# Patient Record
Sex: Male | Born: 1940 | Race: White | Hispanic: No
Health system: Southern US, Community
[De-identification: ages and names within clinical notes are randomized; demographics above are authoritative.]

## PROBLEM LIST (undated history)

## (undated) DIAGNOSIS — E039 Hypothyroidism, unspecified: Secondary | ICD-10-CM

## (undated) DIAGNOSIS — R251 Tremor, unspecified: Secondary | ICD-10-CM

## (undated) DIAGNOSIS — I10 Essential (primary) hypertension: Secondary | ICD-10-CM

## (undated) DIAGNOSIS — F200 Paranoid schizophrenia: Secondary | ICD-10-CM

## (undated) DIAGNOSIS — R339 Retention of urine, unspecified: Secondary | ICD-10-CM

## (undated) DIAGNOSIS — R569 Unspecified convulsions: Secondary | ICD-10-CM

---

## 2011-04-15 DIAGNOSIS — R269 Unspecified abnormalities of gait and mobility: Secondary | ICD-10-CM | POA: Diagnosis not present

## 2011-04-15 DIAGNOSIS — F411 Generalized anxiety disorder: Secondary | ICD-10-CM | POA: Diagnosis not present

## 2011-04-15 DIAGNOSIS — Z466 Encounter for fitting and adjustment of urinary device: Secondary | ICD-10-CM | POA: Diagnosis not present

## 2011-04-15 DIAGNOSIS — I10 Essential (primary) hypertension: Secondary | ICD-10-CM | POA: Diagnosis not present

## 2011-04-15 DIAGNOSIS — F209 Schizophrenia, unspecified: Secondary | ICD-10-CM | POA: Diagnosis not present

## 2011-05-29 DIAGNOSIS — N401 Enlarged prostate with lower urinary tract symptoms: Secondary | ICD-10-CM | POA: Diagnosis not present

## 2011-05-29 DIAGNOSIS — R339 Retention of urine, unspecified: Secondary | ICD-10-CM | POA: Diagnosis not present

## 2011-06-03 DIAGNOSIS — N400 Enlarged prostate without lower urinary tract symptoms: Secondary | ICD-10-CM | POA: Diagnosis not present

## 2011-06-03 DIAGNOSIS — I959 Hypotension, unspecified: Secondary | ICD-10-CM | POA: Diagnosis not present

## 2011-06-03 DIAGNOSIS — F209 Schizophrenia, unspecified: Secondary | ICD-10-CM | POA: Diagnosis not present

## 2011-06-03 DIAGNOSIS — G219 Secondary parkinsonism, unspecified: Secondary | ICD-10-CM | POA: Diagnosis not present

## 2011-06-12 DIAGNOSIS — R4182 Altered mental status, unspecified: Secondary | ICD-10-CM | POA: Diagnosis not present

## 2011-06-12 DIAGNOSIS — N390 Urinary tract infection, site not specified: Secondary | ICD-10-CM | POA: Diagnosis not present

## 2011-06-14 DIAGNOSIS — N401 Enlarged prostate with lower urinary tract symptoms: Secondary | ICD-10-CM | POA: Diagnosis not present

## 2011-06-14 DIAGNOSIS — R269 Unspecified abnormalities of gait and mobility: Secondary | ICD-10-CM | POA: Diagnosis not present

## 2011-06-14 DIAGNOSIS — R339 Retention of urine, unspecified: Secondary | ICD-10-CM | POA: Diagnosis not present

## 2011-06-14 DIAGNOSIS — F209 Schizophrenia, unspecified: Secondary | ICD-10-CM | POA: Diagnosis not present

## 2011-06-14 DIAGNOSIS — I10 Essential (primary) hypertension: Secondary | ICD-10-CM | POA: Diagnosis not present

## 2011-06-15 DIAGNOSIS — F209 Schizophrenia, unspecified: Secondary | ICD-10-CM | POA: Diagnosis not present

## 2011-06-15 DIAGNOSIS — R269 Unspecified abnormalities of gait and mobility: Secondary | ICD-10-CM | POA: Diagnosis not present

## 2011-06-15 DIAGNOSIS — N390 Urinary tract infection, site not specified: Secondary | ICD-10-CM | POA: Diagnosis not present

## 2011-06-15 DIAGNOSIS — I10 Essential (primary) hypertension: Secondary | ICD-10-CM | POA: Diagnosis not present

## 2011-06-15 DIAGNOSIS — N401 Enlarged prostate with lower urinary tract symptoms: Secondary | ICD-10-CM | POA: Diagnosis not present

## 2011-06-15 DIAGNOSIS — N312 Flaccid neuropathic bladder, not elsewhere classified: Secondary | ICD-10-CM | POA: Diagnosis not present

## 2011-06-15 DIAGNOSIS — R339 Retention of urine, unspecified: Secondary | ICD-10-CM | POA: Diagnosis not present

## 2011-06-16 DIAGNOSIS — N138 Other obstructive and reflux uropathy: Secondary | ICD-10-CM | POA: Diagnosis not present

## 2011-06-16 DIAGNOSIS — R269 Unspecified abnormalities of gait and mobility: Secondary | ICD-10-CM | POA: Diagnosis not present

## 2011-06-16 DIAGNOSIS — F209 Schizophrenia, unspecified: Secondary | ICD-10-CM | POA: Diagnosis not present

## 2011-06-16 DIAGNOSIS — R339 Retention of urine, unspecified: Secondary | ICD-10-CM | POA: Diagnosis not present

## 2011-06-16 DIAGNOSIS — I10 Essential (primary) hypertension: Secondary | ICD-10-CM | POA: Diagnosis not present

## 2011-06-18 DIAGNOSIS — R339 Retention of urine, unspecified: Secondary | ICD-10-CM | POA: Diagnosis not present

## 2011-06-18 DIAGNOSIS — N401 Enlarged prostate with lower urinary tract symptoms: Secondary | ICD-10-CM | POA: Diagnosis not present

## 2011-06-18 DIAGNOSIS — F209 Schizophrenia, unspecified: Secondary | ICD-10-CM | POA: Diagnosis not present

## 2011-06-18 DIAGNOSIS — R269 Unspecified abnormalities of gait and mobility: Secondary | ICD-10-CM | POA: Diagnosis not present

## 2011-06-18 DIAGNOSIS — I10 Essential (primary) hypertension: Secondary | ICD-10-CM | POA: Diagnosis not present

## 2011-06-23 DIAGNOSIS — I10 Essential (primary) hypertension: Secondary | ICD-10-CM | POA: Diagnosis not present

## 2011-06-23 DIAGNOSIS — F209 Schizophrenia, unspecified: Secondary | ICD-10-CM | POA: Diagnosis not present

## 2011-06-23 DIAGNOSIS — R269 Unspecified abnormalities of gait and mobility: Secondary | ICD-10-CM | POA: Diagnosis not present

## 2011-06-23 DIAGNOSIS — N138 Other obstructive and reflux uropathy: Secondary | ICD-10-CM | POA: Diagnosis not present

## 2011-06-23 DIAGNOSIS — R339 Retention of urine, unspecified: Secondary | ICD-10-CM | POA: Diagnosis not present

## 2011-06-24 DIAGNOSIS — M79609 Pain in unspecified limb: Secondary | ICD-10-CM | POA: Diagnosis not present

## 2011-06-24 DIAGNOSIS — F209 Schizophrenia, unspecified: Secondary | ICD-10-CM | POA: Diagnosis not present

## 2011-06-24 DIAGNOSIS — R339 Retention of urine, unspecified: Secondary | ICD-10-CM | POA: Diagnosis not present

## 2011-06-24 DIAGNOSIS — R269 Unspecified abnormalities of gait and mobility: Secondary | ICD-10-CM | POA: Diagnosis not present

## 2011-06-24 DIAGNOSIS — Q828 Other specified congenital malformations of skin: Secondary | ICD-10-CM | POA: Diagnosis not present

## 2011-06-24 DIAGNOSIS — I10 Essential (primary) hypertension: Secondary | ICD-10-CM | POA: Diagnosis not present

## 2011-06-24 DIAGNOSIS — N138 Other obstructive and reflux uropathy: Secondary | ICD-10-CM | POA: Diagnosis not present

## 2011-06-24 DIAGNOSIS — B351 Tinea unguium: Secondary | ICD-10-CM | POA: Diagnosis not present

## 2011-06-25 DIAGNOSIS — R339 Retention of urine, unspecified: Secondary | ICD-10-CM | POA: Diagnosis not present

## 2011-06-25 DIAGNOSIS — F209 Schizophrenia, unspecified: Secondary | ICD-10-CM | POA: Diagnosis not present

## 2011-06-25 DIAGNOSIS — R269 Unspecified abnormalities of gait and mobility: Secondary | ICD-10-CM | POA: Diagnosis not present

## 2011-06-25 DIAGNOSIS — N401 Enlarged prostate with lower urinary tract symptoms: Secondary | ICD-10-CM | POA: Diagnosis not present

## 2011-06-25 DIAGNOSIS — I10 Essential (primary) hypertension: Secondary | ICD-10-CM | POA: Diagnosis not present

## 2011-06-26 DIAGNOSIS — R269 Unspecified abnormalities of gait and mobility: Secondary | ICD-10-CM | POA: Diagnosis not present

## 2011-06-26 DIAGNOSIS — I10 Essential (primary) hypertension: Secondary | ICD-10-CM | POA: Diagnosis not present

## 2011-06-26 DIAGNOSIS — F209 Schizophrenia, unspecified: Secondary | ICD-10-CM | POA: Diagnosis not present

## 2011-06-26 DIAGNOSIS — N401 Enlarged prostate with lower urinary tract symptoms: Secondary | ICD-10-CM | POA: Diagnosis not present

## 2011-06-26 DIAGNOSIS — R339 Retention of urine, unspecified: Secondary | ICD-10-CM | POA: Diagnosis not present

## 2011-06-30 DIAGNOSIS — F209 Schizophrenia, unspecified: Secondary | ICD-10-CM | POA: Diagnosis not present

## 2011-06-30 DIAGNOSIS — R269 Unspecified abnormalities of gait and mobility: Secondary | ICD-10-CM | POA: Diagnosis not present

## 2011-06-30 DIAGNOSIS — R339 Retention of urine, unspecified: Secondary | ICD-10-CM | POA: Diagnosis not present

## 2011-06-30 DIAGNOSIS — I10 Essential (primary) hypertension: Secondary | ICD-10-CM | POA: Diagnosis not present

## 2011-06-30 DIAGNOSIS — N138 Other obstructive and reflux uropathy: Secondary | ICD-10-CM | POA: Diagnosis not present

## 2011-07-01 DIAGNOSIS — N138 Other obstructive and reflux uropathy: Secondary | ICD-10-CM | POA: Diagnosis not present

## 2011-07-01 DIAGNOSIS — I10 Essential (primary) hypertension: Secondary | ICD-10-CM | POA: Diagnosis not present

## 2011-07-01 DIAGNOSIS — F209 Schizophrenia, unspecified: Secondary | ICD-10-CM | POA: Diagnosis not present

## 2011-07-01 DIAGNOSIS — R269 Unspecified abnormalities of gait and mobility: Secondary | ICD-10-CM | POA: Diagnosis not present

## 2011-07-01 DIAGNOSIS — R339 Retention of urine, unspecified: Secondary | ICD-10-CM | POA: Diagnosis not present

## 2011-07-03 DIAGNOSIS — F209 Schizophrenia, unspecified: Secondary | ICD-10-CM | POA: Diagnosis not present

## 2011-07-03 DIAGNOSIS — R269 Unspecified abnormalities of gait and mobility: Secondary | ICD-10-CM | POA: Diagnosis not present

## 2011-07-03 DIAGNOSIS — I10 Essential (primary) hypertension: Secondary | ICD-10-CM | POA: Diagnosis not present

## 2011-07-03 DIAGNOSIS — N138 Other obstructive and reflux uropathy: Secondary | ICD-10-CM | POA: Diagnosis not present

## 2011-07-03 DIAGNOSIS — R339 Retention of urine, unspecified: Secondary | ICD-10-CM | POA: Diagnosis not present

## 2011-07-07 DIAGNOSIS — N138 Other obstructive and reflux uropathy: Secondary | ICD-10-CM | POA: Diagnosis not present

## 2011-07-07 DIAGNOSIS — I10 Essential (primary) hypertension: Secondary | ICD-10-CM | POA: Diagnosis not present

## 2011-07-07 DIAGNOSIS — R269 Unspecified abnormalities of gait and mobility: Secondary | ICD-10-CM | POA: Diagnosis not present

## 2011-07-07 DIAGNOSIS — R339 Retention of urine, unspecified: Secondary | ICD-10-CM | POA: Diagnosis not present

## 2011-07-07 DIAGNOSIS — F209 Schizophrenia, unspecified: Secondary | ICD-10-CM | POA: Diagnosis not present

## 2011-07-09 DIAGNOSIS — F209 Schizophrenia, unspecified: Secondary | ICD-10-CM | POA: Diagnosis not present

## 2011-07-09 DIAGNOSIS — R339 Retention of urine, unspecified: Secondary | ICD-10-CM | POA: Diagnosis not present

## 2011-07-09 DIAGNOSIS — R269 Unspecified abnormalities of gait and mobility: Secondary | ICD-10-CM | POA: Diagnosis not present

## 2011-07-09 DIAGNOSIS — N401 Enlarged prostate with lower urinary tract symptoms: Secondary | ICD-10-CM | POA: Diagnosis not present

## 2011-07-09 DIAGNOSIS — I10 Essential (primary) hypertension: Secondary | ICD-10-CM | POA: Diagnosis not present

## 2011-07-10 DIAGNOSIS — R339 Retention of urine, unspecified: Secondary | ICD-10-CM | POA: Diagnosis not present

## 2011-07-10 DIAGNOSIS — F209 Schizophrenia, unspecified: Secondary | ICD-10-CM | POA: Diagnosis not present

## 2011-07-10 DIAGNOSIS — I10 Essential (primary) hypertension: Secondary | ICD-10-CM | POA: Diagnosis not present

## 2011-07-10 DIAGNOSIS — R269 Unspecified abnormalities of gait and mobility: Secondary | ICD-10-CM | POA: Diagnosis not present

## 2011-07-10 DIAGNOSIS — N390 Urinary tract infection, site not specified: Secondary | ICD-10-CM | POA: Diagnosis not present

## 2011-07-10 DIAGNOSIS — N401 Enlarged prostate with lower urinary tract symptoms: Secondary | ICD-10-CM | POA: Diagnosis not present

## 2011-07-13 DIAGNOSIS — R269 Unspecified abnormalities of gait and mobility: Secondary | ICD-10-CM | POA: Diagnosis not present

## 2011-07-13 DIAGNOSIS — N138 Other obstructive and reflux uropathy: Secondary | ICD-10-CM | POA: Diagnosis not present

## 2011-07-13 DIAGNOSIS — R339 Retention of urine, unspecified: Secondary | ICD-10-CM | POA: Diagnosis not present

## 2011-07-13 DIAGNOSIS — I10 Essential (primary) hypertension: Secondary | ICD-10-CM | POA: Diagnosis not present

## 2011-07-13 DIAGNOSIS — F209 Schizophrenia, unspecified: Secondary | ICD-10-CM | POA: Diagnosis not present

## 2011-07-14 DIAGNOSIS — F411 Generalized anxiety disorder: Secondary | ICD-10-CM | POA: Diagnosis not present

## 2011-07-14 DIAGNOSIS — F209 Schizophrenia, unspecified: Secondary | ICD-10-CM | POA: Diagnosis not present

## 2011-07-14 DIAGNOSIS — N400 Enlarged prostate without lower urinary tract symptoms: Secondary | ICD-10-CM | POA: Diagnosis not present

## 2011-07-14 DIAGNOSIS — N401 Enlarged prostate with lower urinary tract symptoms: Secondary | ICD-10-CM | POA: Diagnosis not present

## 2011-07-14 DIAGNOSIS — I10 Essential (primary) hypertension: Secondary | ICD-10-CM | POA: Diagnosis not present

## 2011-07-14 DIAGNOSIS — R269 Unspecified abnormalities of gait and mobility: Secondary | ICD-10-CM | POA: Diagnosis not present

## 2011-07-14 DIAGNOSIS — I959 Hypotension, unspecified: Secondary | ICD-10-CM | POA: Diagnosis not present

## 2011-07-14 DIAGNOSIS — R339 Retention of urine, unspecified: Secondary | ICD-10-CM | POA: Diagnosis not present

## 2011-07-17 DIAGNOSIS — F209 Schizophrenia, unspecified: Secondary | ICD-10-CM | POA: Diagnosis not present

## 2011-07-17 DIAGNOSIS — R339 Retention of urine, unspecified: Secondary | ICD-10-CM | POA: Diagnosis not present

## 2011-07-17 DIAGNOSIS — I10 Essential (primary) hypertension: Secondary | ICD-10-CM | POA: Diagnosis not present

## 2011-07-17 DIAGNOSIS — R269 Unspecified abnormalities of gait and mobility: Secondary | ICD-10-CM | POA: Diagnosis not present

## 2011-07-17 DIAGNOSIS — N138 Other obstructive and reflux uropathy: Secondary | ICD-10-CM | POA: Diagnosis not present

## 2011-07-21 DIAGNOSIS — F209 Schizophrenia, unspecified: Secondary | ICD-10-CM | POA: Diagnosis not present

## 2011-07-21 DIAGNOSIS — R269 Unspecified abnormalities of gait and mobility: Secondary | ICD-10-CM | POA: Diagnosis not present

## 2011-07-21 DIAGNOSIS — R339 Retention of urine, unspecified: Secondary | ICD-10-CM | POA: Diagnosis not present

## 2011-07-21 DIAGNOSIS — I10 Essential (primary) hypertension: Secondary | ICD-10-CM | POA: Diagnosis not present

## 2011-07-21 DIAGNOSIS — N138 Other obstructive and reflux uropathy: Secondary | ICD-10-CM | POA: Diagnosis not present

## 2011-07-27 DIAGNOSIS — N401 Enlarged prostate with lower urinary tract symptoms: Secondary | ICD-10-CM | POA: Diagnosis not present

## 2011-07-27 DIAGNOSIS — N312 Flaccid neuropathic bladder, not elsewhere classified: Secondary | ICD-10-CM | POA: Diagnosis not present

## 2011-07-27 DIAGNOSIS — R339 Retention of urine, unspecified: Secondary | ICD-10-CM | POA: Diagnosis not present

## 2011-07-28 DIAGNOSIS — I10 Essential (primary) hypertension: Secondary | ICD-10-CM | POA: Diagnosis not present

## 2011-07-28 DIAGNOSIS — R269 Unspecified abnormalities of gait and mobility: Secondary | ICD-10-CM | POA: Diagnosis not present

## 2011-07-28 DIAGNOSIS — N401 Enlarged prostate with lower urinary tract symptoms: Secondary | ICD-10-CM | POA: Diagnosis not present

## 2011-07-28 DIAGNOSIS — R339 Retention of urine, unspecified: Secondary | ICD-10-CM | POA: Diagnosis not present

## 2011-07-28 DIAGNOSIS — F209 Schizophrenia, unspecified: Secondary | ICD-10-CM | POA: Diagnosis not present

## 2011-07-31 DIAGNOSIS — R5381 Other malaise: Secondary | ICD-10-CM | POA: Diagnosis not present

## 2011-07-31 DIAGNOSIS — N400 Enlarged prostate without lower urinary tract symptoms: Secondary | ICD-10-CM | POA: Diagnosis not present

## 2011-07-31 DIAGNOSIS — R578 Other shock: Secondary | ICD-10-CM | POA: Diagnosis not present

## 2011-07-31 DIAGNOSIS — N390 Urinary tract infection, site not specified: Secondary | ICD-10-CM | POA: Diagnosis not present

## 2011-07-31 DIAGNOSIS — R262 Difficulty in walking, not elsewhere classified: Secondary | ICD-10-CM | POA: Diagnosis present

## 2011-07-31 DIAGNOSIS — E039 Hypothyroidism, unspecified: Secondary | ICD-10-CM | POA: Diagnosis not present

## 2011-07-31 DIAGNOSIS — B952 Enterococcus as the cause of diseases classified elsewhere: Secondary | ICD-10-CM | POA: Diagnosis not present

## 2011-07-31 DIAGNOSIS — I10 Essential (primary) hypertension: Secondary | ICD-10-CM | POA: Diagnosis not present

## 2011-07-31 DIAGNOSIS — I959 Hypotension, unspecified: Secondary | ICD-10-CM | POA: Diagnosis not present

## 2011-07-31 DIAGNOSIS — A419 Sepsis, unspecified organism: Secondary | ICD-10-CM | POA: Diagnosis not present

## 2011-07-31 DIAGNOSIS — G40909 Epilepsy, unspecified, not intractable, without status epilepticus: Secondary | ICD-10-CM | POA: Diagnosis not present

## 2011-07-31 DIAGNOSIS — N401 Enlarged prostate with lower urinary tract symptoms: Secondary | ICD-10-CM | POA: Diagnosis present

## 2011-07-31 DIAGNOSIS — R6889 Other general symptoms and signs: Secondary | ICD-10-CM | POA: Diagnosis not present

## 2011-07-31 DIAGNOSIS — N320 Bladder-neck obstruction: Secondary | ICD-10-CM | POA: Diagnosis present

## 2011-07-31 DIAGNOSIS — Z9181 History of falling: Secondary | ICD-10-CM | POA: Diagnosis not present

## 2011-07-31 DIAGNOSIS — R5383 Other fatigue: Secondary | ICD-10-CM | POA: Diagnosis not present

## 2011-07-31 DIAGNOSIS — N319 Neuromuscular dysfunction of bladder, unspecified: Secondary | ICD-10-CM | POA: Diagnosis not present

## 2011-07-31 DIAGNOSIS — Z7982 Long term (current) use of aspirin: Secondary | ICD-10-CM | POA: Diagnosis not present

## 2011-07-31 DIAGNOSIS — R569 Unspecified convulsions: Secondary | ICD-10-CM | POA: Diagnosis not present

## 2011-07-31 DIAGNOSIS — R6520 Severe sepsis without septic shock: Secondary | ICD-10-CM | POA: Diagnosis not present

## 2011-07-31 DIAGNOSIS — R339 Retention of urine, unspecified: Secondary | ICD-10-CM | POA: Diagnosis not present

## 2011-07-31 DIAGNOSIS — E876 Hypokalemia: Secondary | ICD-10-CM | POA: Diagnosis not present

## 2011-07-31 DIAGNOSIS — R6521 Severe sepsis with septic shock: Secondary | ICD-10-CM | POA: Diagnosis present

## 2011-07-31 DIAGNOSIS — M6282 Rhabdomyolysis: Secondary | ICD-10-CM | POA: Diagnosis not present

## 2011-07-31 DIAGNOSIS — F209 Schizophrenia, unspecified: Secondary | ICD-10-CM | POA: Diagnosis present

## 2011-08-01 DIAGNOSIS — R339 Retention of urine, unspecified: Secondary | ICD-10-CM | POA: Diagnosis not present

## 2011-08-01 DIAGNOSIS — N319 Neuromuscular dysfunction of bladder, unspecified: Secondary | ICD-10-CM | POA: Diagnosis not present

## 2011-08-06 DIAGNOSIS — R269 Unspecified abnormalities of gait and mobility: Secondary | ICD-10-CM | POA: Diagnosis not present

## 2011-08-06 DIAGNOSIS — N138 Other obstructive and reflux uropathy: Secondary | ICD-10-CM | POA: Diagnosis not present

## 2011-08-06 DIAGNOSIS — R339 Retention of urine, unspecified: Secondary | ICD-10-CM | POA: Diagnosis not present

## 2011-08-06 DIAGNOSIS — I10 Essential (primary) hypertension: Secondary | ICD-10-CM | POA: Diagnosis not present

## 2011-08-06 DIAGNOSIS — F209 Schizophrenia, unspecified: Secondary | ICD-10-CM | POA: Diagnosis not present

## 2011-08-07 DIAGNOSIS — F209 Schizophrenia, unspecified: Secondary | ICD-10-CM | POA: Diagnosis not present

## 2011-08-07 DIAGNOSIS — N400 Enlarged prostate without lower urinary tract symptoms: Secondary | ICD-10-CM | POA: Diagnosis not present

## 2011-08-07 DIAGNOSIS — N138 Other obstructive and reflux uropathy: Secondary | ICD-10-CM | POA: Diagnosis not present

## 2011-08-07 DIAGNOSIS — I10 Essential (primary) hypertension: Secondary | ICD-10-CM | POA: Diagnosis not present

## 2011-08-07 DIAGNOSIS — R339 Retention of urine, unspecified: Secondary | ICD-10-CM | POA: Diagnosis not present

## 2011-08-07 DIAGNOSIS — R269 Unspecified abnormalities of gait and mobility: Secondary | ICD-10-CM | POA: Diagnosis not present

## 2011-08-10 DIAGNOSIS — R339 Retention of urine, unspecified: Secondary | ICD-10-CM | POA: Diagnosis not present

## 2011-08-10 DIAGNOSIS — I10 Essential (primary) hypertension: Secondary | ICD-10-CM | POA: Diagnosis not present

## 2011-08-10 DIAGNOSIS — F209 Schizophrenia, unspecified: Secondary | ICD-10-CM | POA: Diagnosis not present

## 2011-08-10 DIAGNOSIS — R269 Unspecified abnormalities of gait and mobility: Secondary | ICD-10-CM | POA: Diagnosis not present

## 2011-08-10 DIAGNOSIS — N401 Enlarged prostate with lower urinary tract symptoms: Secondary | ICD-10-CM | POA: Diagnosis not present

## 2011-08-11 DIAGNOSIS — H3530 Unspecified macular degeneration: Secondary | ICD-10-CM | POA: Diagnosis not present

## 2011-08-11 DIAGNOSIS — H2589 Other age-related cataract: Secondary | ICD-10-CM | POA: Diagnosis not present

## 2011-08-11 DIAGNOSIS — H04129 Dry eye syndrome of unspecified lacrimal gland: Secondary | ICD-10-CM | POA: Diagnosis not present

## 2011-08-11 DIAGNOSIS — H524 Presbyopia: Secondary | ICD-10-CM | POA: Diagnosis not present

## 2011-08-12 DIAGNOSIS — R339 Retention of urine, unspecified: Secondary | ICD-10-CM | POA: Diagnosis not present

## 2011-08-12 DIAGNOSIS — N401 Enlarged prostate with lower urinary tract symptoms: Secondary | ICD-10-CM | POA: Diagnosis not present

## 2011-08-12 DIAGNOSIS — F209 Schizophrenia, unspecified: Secondary | ICD-10-CM | POA: Diagnosis not present

## 2011-08-12 DIAGNOSIS — R269 Unspecified abnormalities of gait and mobility: Secondary | ICD-10-CM | POA: Diagnosis not present

## 2011-08-12 DIAGNOSIS — I10 Essential (primary) hypertension: Secondary | ICD-10-CM | POA: Diagnosis not present

## 2011-08-13 DIAGNOSIS — G40909 Epilepsy, unspecified, not intractable, without status epilepticus: Secondary | ICD-10-CM | POA: Diagnosis not present

## 2011-08-13 DIAGNOSIS — N138 Other obstructive and reflux uropathy: Secondary | ICD-10-CM | POA: Diagnosis not present

## 2011-08-13 DIAGNOSIS — R269 Unspecified abnormalities of gait and mobility: Secondary | ICD-10-CM | POA: Diagnosis not present

## 2011-08-13 DIAGNOSIS — I10 Essential (primary) hypertension: Secondary | ICD-10-CM | POA: Diagnosis not present

## 2011-08-13 DIAGNOSIS — R339 Retention of urine, unspecified: Secondary | ICD-10-CM | POA: Diagnosis not present

## 2011-08-17 DIAGNOSIS — N312 Flaccid neuropathic bladder, not elsewhere classified: Secondary | ICD-10-CM | POA: Diagnosis not present

## 2011-08-17 DIAGNOSIS — R339 Retention of urine, unspecified: Secondary | ICD-10-CM | POA: Diagnosis not present

## 2011-08-18 DIAGNOSIS — J00 Acute nasopharyngitis [common cold]: Secondary | ICD-10-CM | POA: Diagnosis not present

## 2011-08-26 DIAGNOSIS — J209 Acute bronchitis, unspecified: Secondary | ICD-10-CM | POA: Diagnosis not present

## 2011-08-26 DIAGNOSIS — L22 Diaper dermatitis: Secondary | ICD-10-CM | POA: Diagnosis not present

## 2011-09-04 DIAGNOSIS — R269 Unspecified abnormalities of gait and mobility: Secondary | ICD-10-CM | POA: Diagnosis not present

## 2011-09-04 DIAGNOSIS — F209 Schizophrenia, unspecified: Secondary | ICD-10-CM | POA: Diagnosis not present

## 2011-09-04 DIAGNOSIS — F411 Generalized anxiety disorder: Secondary | ICD-10-CM | POA: Diagnosis not present

## 2011-09-04 DIAGNOSIS — H3530 Unspecified macular degeneration: Secondary | ICD-10-CM | POA: Diagnosis not present

## 2011-09-04 DIAGNOSIS — N400 Enlarged prostate without lower urinary tract symptoms: Secondary | ICD-10-CM | POA: Diagnosis not present

## 2011-09-08 DIAGNOSIS — I10 Essential (primary) hypertension: Secondary | ICD-10-CM | POA: Diagnosis not present

## 2011-09-08 DIAGNOSIS — L22 Diaper dermatitis: Secondary | ICD-10-CM | POA: Diagnosis not present

## 2011-09-08 DIAGNOSIS — G4700 Insomnia, unspecified: Secondary | ICD-10-CM | POA: Diagnosis not present

## 2011-09-08 DIAGNOSIS — N400 Enlarged prostate without lower urinary tract symptoms: Secondary | ICD-10-CM | POA: Diagnosis not present

## 2011-09-21 DIAGNOSIS — R351 Nocturia: Secondary | ICD-10-CM | POA: Diagnosis not present

## 2011-09-21 DIAGNOSIS — N390 Urinary tract infection, site not specified: Secondary | ICD-10-CM | POA: Diagnosis not present

## 2011-09-21 DIAGNOSIS — N318 Other neuromuscular dysfunction of bladder: Secondary | ICD-10-CM | POA: Diagnosis not present

## 2011-09-21 DIAGNOSIS — R339 Retention of urine, unspecified: Secondary | ICD-10-CM | POA: Diagnosis not present

## 2011-09-24 DIAGNOSIS — N318 Other neuromuscular dysfunction of bladder: Secondary | ICD-10-CM | POA: Diagnosis not present

## 2011-09-24 DIAGNOSIS — N390 Urinary tract infection, site not specified: Secondary | ICD-10-CM | POA: Diagnosis not present

## 2011-09-24 DIAGNOSIS — R339 Retention of urine, unspecified: Secondary | ICD-10-CM | POA: Diagnosis not present

## 2011-09-24 DIAGNOSIS — N401 Enlarged prostate with lower urinary tract symptoms: Secondary | ICD-10-CM | POA: Diagnosis not present

## 2011-09-27 DIAGNOSIS — N390 Urinary tract infection, site not specified: Secondary | ICD-10-CM | POA: Diagnosis not present

## 2011-09-29 DIAGNOSIS — F200 Paranoid schizophrenia: Secondary | ICD-10-CM | POA: Diagnosis not present

## 2011-09-30 DIAGNOSIS — N390 Urinary tract infection, site not specified: Secondary | ICD-10-CM | POA: Diagnosis not present

## 2011-09-30 DIAGNOSIS — N401 Enlarged prostate with lower urinary tract symptoms: Secondary | ICD-10-CM | POA: Diagnosis not present

## 2011-09-30 DIAGNOSIS — N318 Other neuromuscular dysfunction of bladder: Secondary | ICD-10-CM | POA: Diagnosis not present

## 2011-09-30 DIAGNOSIS — R339 Retention of urine, unspecified: Secondary | ICD-10-CM | POA: Diagnosis not present

## 2011-10-06 DIAGNOSIS — N390 Urinary tract infection, site not specified: Secondary | ICD-10-CM | POA: Diagnosis not present

## 2011-10-06 DIAGNOSIS — N401 Enlarged prostate with lower urinary tract symptoms: Secondary | ICD-10-CM | POA: Diagnosis not present

## 2011-10-06 DIAGNOSIS — N318 Other neuromuscular dysfunction of bladder: Secondary | ICD-10-CM | POA: Diagnosis not present

## 2011-10-06 DIAGNOSIS — R339 Retention of urine, unspecified: Secondary | ICD-10-CM | POA: Diagnosis not present

## 2011-10-09 DIAGNOSIS — I10 Essential (primary) hypertension: Secondary | ICD-10-CM | POA: Diagnosis not present

## 2011-10-09 DIAGNOSIS — G4700 Insomnia, unspecified: Secondary | ICD-10-CM | POA: Diagnosis not present

## 2011-10-09 DIAGNOSIS — N400 Enlarged prostate without lower urinary tract symptoms: Secondary | ICD-10-CM | POA: Diagnosis not present

## 2011-10-12 DIAGNOSIS — N138 Other obstructive and reflux uropathy: Secondary | ICD-10-CM | POA: Diagnosis not present

## 2011-10-12 DIAGNOSIS — G40909 Epilepsy, unspecified, not intractable, without status epilepticus: Secondary | ICD-10-CM | POA: Diagnosis not present

## 2011-10-12 DIAGNOSIS — F209 Schizophrenia, unspecified: Secondary | ICD-10-CM | POA: Diagnosis not present

## 2011-10-12 DIAGNOSIS — I10 Essential (primary) hypertension: Secondary | ICD-10-CM | POA: Diagnosis not present

## 2011-10-12 DIAGNOSIS — R339 Retention of urine, unspecified: Secondary | ICD-10-CM | POA: Diagnosis not present

## 2011-10-13 DIAGNOSIS — G40909 Epilepsy, unspecified, not intractable, without status epilepticus: Secondary | ICD-10-CM | POA: Diagnosis not present

## 2011-10-13 DIAGNOSIS — I10 Essential (primary) hypertension: Secondary | ICD-10-CM | POA: Diagnosis not present

## 2011-10-13 DIAGNOSIS — F209 Schizophrenia, unspecified: Secondary | ICD-10-CM | POA: Diagnosis not present

## 2011-10-13 DIAGNOSIS — N138 Other obstructive and reflux uropathy: Secondary | ICD-10-CM | POA: Diagnosis not present

## 2011-10-13 DIAGNOSIS — R339 Retention of urine, unspecified: Secondary | ICD-10-CM | POA: Diagnosis not present

## 2011-10-16 DIAGNOSIS — G40909 Epilepsy, unspecified, not intractable, without status epilepticus: Secondary | ICD-10-CM | POA: Diagnosis not present

## 2011-10-16 DIAGNOSIS — I10 Essential (primary) hypertension: Secondary | ICD-10-CM | POA: Diagnosis not present

## 2011-10-16 DIAGNOSIS — N401 Enlarged prostate with lower urinary tract symptoms: Secondary | ICD-10-CM | POA: Diagnosis not present

## 2011-10-16 DIAGNOSIS — F209 Schizophrenia, unspecified: Secondary | ICD-10-CM | POA: Diagnosis not present

## 2011-10-16 DIAGNOSIS — R339 Retention of urine, unspecified: Secondary | ICD-10-CM | POA: Diagnosis not present

## 2011-10-20 DIAGNOSIS — N390 Urinary tract infection, site not specified: Secondary | ICD-10-CM | POA: Diagnosis not present

## 2011-10-20 DIAGNOSIS — N318 Other neuromuscular dysfunction of bladder: Secondary | ICD-10-CM | POA: Diagnosis not present

## 2011-10-20 DIAGNOSIS — N401 Enlarged prostate with lower urinary tract symptoms: Secondary | ICD-10-CM | POA: Diagnosis not present

## 2011-10-20 DIAGNOSIS — R339 Retention of urine, unspecified: Secondary | ICD-10-CM | POA: Diagnosis not present

## 2011-10-21 DIAGNOSIS — R339 Retention of urine, unspecified: Secondary | ICD-10-CM | POA: Diagnosis not present

## 2011-10-21 DIAGNOSIS — F209 Schizophrenia, unspecified: Secondary | ICD-10-CM | POA: Diagnosis not present

## 2011-10-21 DIAGNOSIS — I10 Essential (primary) hypertension: Secondary | ICD-10-CM | POA: Diagnosis not present

## 2011-10-21 DIAGNOSIS — G40909 Epilepsy, unspecified, not intractable, without status epilepticus: Secondary | ICD-10-CM | POA: Diagnosis not present

## 2011-10-21 DIAGNOSIS — N401 Enlarged prostate with lower urinary tract symptoms: Secondary | ICD-10-CM | POA: Diagnosis not present

## 2011-10-22 DIAGNOSIS — F209 Schizophrenia, unspecified: Secondary | ICD-10-CM | POA: Diagnosis not present

## 2011-10-22 DIAGNOSIS — R339 Retention of urine, unspecified: Secondary | ICD-10-CM | POA: Diagnosis not present

## 2011-10-22 DIAGNOSIS — N401 Enlarged prostate with lower urinary tract symptoms: Secondary | ICD-10-CM | POA: Diagnosis not present

## 2011-10-22 DIAGNOSIS — I10 Essential (primary) hypertension: Secondary | ICD-10-CM | POA: Diagnosis not present

## 2011-10-22 DIAGNOSIS — G40909 Epilepsy, unspecified, not intractable, without status epilepticus: Secondary | ICD-10-CM | POA: Diagnosis not present

## 2011-10-26 DIAGNOSIS — N138 Other obstructive and reflux uropathy: Secondary | ICD-10-CM | POA: Diagnosis not present

## 2011-10-26 DIAGNOSIS — R339 Retention of urine, unspecified: Secondary | ICD-10-CM | POA: Diagnosis not present

## 2011-10-26 DIAGNOSIS — I10 Essential (primary) hypertension: Secondary | ICD-10-CM | POA: Diagnosis not present

## 2011-10-26 DIAGNOSIS — G40909 Epilepsy, unspecified, not intractable, without status epilepticus: Secondary | ICD-10-CM | POA: Diagnosis not present

## 2011-10-26 DIAGNOSIS — F209 Schizophrenia, unspecified: Secondary | ICD-10-CM | POA: Diagnosis not present

## 2011-10-28 DIAGNOSIS — R339 Retention of urine, unspecified: Secondary | ICD-10-CM | POA: Diagnosis not present

## 2011-10-28 DIAGNOSIS — N390 Urinary tract infection, site not specified: Secondary | ICD-10-CM | POA: Diagnosis not present

## 2011-10-28 DIAGNOSIS — N318 Other neuromuscular dysfunction of bladder: Secondary | ICD-10-CM | POA: Diagnosis not present

## 2011-10-29 DIAGNOSIS — N138 Other obstructive and reflux uropathy: Secondary | ICD-10-CM | POA: Diagnosis not present

## 2011-10-29 DIAGNOSIS — R339 Retention of urine, unspecified: Secondary | ICD-10-CM | POA: Diagnosis not present

## 2011-10-29 DIAGNOSIS — G40909 Epilepsy, unspecified, not intractable, without status epilepticus: Secondary | ICD-10-CM | POA: Diagnosis not present

## 2011-10-29 DIAGNOSIS — I10 Essential (primary) hypertension: Secondary | ICD-10-CM | POA: Diagnosis not present

## 2011-10-29 DIAGNOSIS — F209 Schizophrenia, unspecified: Secondary | ICD-10-CM | POA: Diagnosis not present

## 2011-10-30 DIAGNOSIS — N390 Urinary tract infection, site not specified: Secondary | ICD-10-CM | POA: Diagnosis not present

## 2011-11-04 DIAGNOSIS — G40909 Epilepsy, unspecified, not intractable, without status epilepticus: Secondary | ICD-10-CM | POA: Diagnosis not present

## 2011-11-04 DIAGNOSIS — R339 Retention of urine, unspecified: Secondary | ICD-10-CM | POA: Diagnosis not present

## 2011-11-04 DIAGNOSIS — N401 Enlarged prostate with lower urinary tract symptoms: Secondary | ICD-10-CM | POA: Diagnosis not present

## 2011-11-04 DIAGNOSIS — I10 Essential (primary) hypertension: Secondary | ICD-10-CM | POA: Diagnosis not present

## 2011-11-04 DIAGNOSIS — F209 Schizophrenia, unspecified: Secondary | ICD-10-CM | POA: Diagnosis not present

## 2011-11-06 DIAGNOSIS — F209 Schizophrenia, unspecified: Secondary | ICD-10-CM | POA: Diagnosis not present

## 2011-11-06 DIAGNOSIS — N138 Other obstructive and reflux uropathy: Secondary | ICD-10-CM | POA: Diagnosis not present

## 2011-11-06 DIAGNOSIS — I10 Essential (primary) hypertension: Secondary | ICD-10-CM | POA: Diagnosis not present

## 2011-11-06 DIAGNOSIS — R339 Retention of urine, unspecified: Secondary | ICD-10-CM | POA: Diagnosis not present

## 2011-11-06 DIAGNOSIS — G40909 Epilepsy, unspecified, not intractable, without status epilepticus: Secondary | ICD-10-CM | POA: Diagnosis not present

## 2011-11-11 DIAGNOSIS — R339 Retention of urine, unspecified: Secondary | ICD-10-CM | POA: Diagnosis not present

## 2011-11-11 DIAGNOSIS — I10 Essential (primary) hypertension: Secondary | ICD-10-CM | POA: Diagnosis not present

## 2011-11-11 DIAGNOSIS — N390 Urinary tract infection, site not specified: Secondary | ICD-10-CM | POA: Diagnosis not present

## 2011-11-11 DIAGNOSIS — F209 Schizophrenia, unspecified: Secondary | ICD-10-CM | POA: Diagnosis not present

## 2011-11-11 DIAGNOSIS — N318 Other neuromuscular dysfunction of bladder: Secondary | ICD-10-CM | POA: Diagnosis not present

## 2011-11-11 DIAGNOSIS — N138 Other obstructive and reflux uropathy: Secondary | ICD-10-CM | POA: Diagnosis not present

## 2011-11-11 DIAGNOSIS — G40909 Epilepsy, unspecified, not intractable, without status epilepticus: Secondary | ICD-10-CM | POA: Diagnosis not present

## 2011-11-13 DIAGNOSIS — F209 Schizophrenia, unspecified: Secondary | ICD-10-CM | POA: Diagnosis not present

## 2011-11-13 DIAGNOSIS — N138 Other obstructive and reflux uropathy: Secondary | ICD-10-CM | POA: Diagnosis not present

## 2011-11-13 DIAGNOSIS — R339 Retention of urine, unspecified: Secondary | ICD-10-CM | POA: Diagnosis not present

## 2011-11-13 DIAGNOSIS — I10 Essential (primary) hypertension: Secondary | ICD-10-CM | POA: Diagnosis not present

## 2011-11-13 DIAGNOSIS — G40909 Epilepsy, unspecified, not intractable, without status epilepticus: Secondary | ICD-10-CM | POA: Diagnosis not present

## 2011-11-19 DIAGNOSIS — I10 Essential (primary) hypertension: Secondary | ICD-10-CM | POA: Diagnosis not present

## 2011-11-19 DIAGNOSIS — R339 Retention of urine, unspecified: Secondary | ICD-10-CM | POA: Diagnosis not present

## 2011-11-19 DIAGNOSIS — G40909 Epilepsy, unspecified, not intractable, without status epilepticus: Secondary | ICD-10-CM | POA: Diagnosis not present

## 2011-11-19 DIAGNOSIS — N401 Enlarged prostate with lower urinary tract symptoms: Secondary | ICD-10-CM | POA: Diagnosis not present

## 2011-11-19 DIAGNOSIS — F209 Schizophrenia, unspecified: Secondary | ICD-10-CM | POA: Diagnosis not present

## 2011-11-24 DIAGNOSIS — N138 Other obstructive and reflux uropathy: Secondary | ICD-10-CM | POA: Diagnosis not present

## 2011-11-24 DIAGNOSIS — F209 Schizophrenia, unspecified: Secondary | ICD-10-CM | POA: Diagnosis not present

## 2011-11-24 DIAGNOSIS — R339 Retention of urine, unspecified: Secondary | ICD-10-CM | POA: Diagnosis not present

## 2011-11-24 DIAGNOSIS — I10 Essential (primary) hypertension: Secondary | ICD-10-CM | POA: Diagnosis not present

## 2011-11-24 DIAGNOSIS — G40909 Epilepsy, unspecified, not intractable, without status epilepticus: Secondary | ICD-10-CM | POA: Diagnosis not present

## 2011-11-26 DIAGNOSIS — S8000XA Contusion of unspecified knee, initial encounter: Secondary | ICD-10-CM | POA: Diagnosis not present

## 2011-11-26 DIAGNOSIS — W1809XA Striking against other object with subsequent fall, initial encounter: Secondary | ICD-10-CM | POA: Diagnosis not present

## 2011-11-26 DIAGNOSIS — I10 Essential (primary) hypertension: Secondary | ICD-10-CM | POA: Diagnosis not present

## 2011-11-30 DIAGNOSIS — N401 Enlarged prostate with lower urinary tract symptoms: Secondary | ICD-10-CM | POA: Diagnosis not present

## 2011-11-30 DIAGNOSIS — I10 Essential (primary) hypertension: Secondary | ICD-10-CM | POA: Diagnosis not present

## 2011-11-30 DIAGNOSIS — G40909 Epilepsy, unspecified, not intractable, without status epilepticus: Secondary | ICD-10-CM | POA: Diagnosis not present

## 2011-11-30 DIAGNOSIS — F209 Schizophrenia, unspecified: Secondary | ICD-10-CM | POA: Diagnosis not present

## 2011-11-30 DIAGNOSIS — R339 Retention of urine, unspecified: Secondary | ICD-10-CM | POA: Diagnosis not present

## 2011-12-09 DIAGNOSIS — R339 Retention of urine, unspecified: Secondary | ICD-10-CM | POA: Diagnosis not present

## 2011-12-09 DIAGNOSIS — G40909 Epilepsy, unspecified, not intractable, without status epilepticus: Secondary | ICD-10-CM | POA: Diagnosis not present

## 2011-12-09 DIAGNOSIS — N138 Other obstructive and reflux uropathy: Secondary | ICD-10-CM | POA: Diagnosis not present

## 2011-12-09 DIAGNOSIS — I10 Essential (primary) hypertension: Secondary | ICD-10-CM | POA: Diagnosis not present

## 2011-12-09 DIAGNOSIS — F209 Schizophrenia, unspecified: Secondary | ICD-10-CM | POA: Diagnosis not present

## 2011-12-10 DIAGNOSIS — G4700 Insomnia, unspecified: Secondary | ICD-10-CM | POA: Diagnosis not present

## 2011-12-10 DIAGNOSIS — I10 Essential (primary) hypertension: Secondary | ICD-10-CM | POA: Diagnosis not present

## 2011-12-10 DIAGNOSIS — N400 Enlarged prostate without lower urinary tract symptoms: Secondary | ICD-10-CM | POA: Diagnosis not present

## 2011-12-15 DIAGNOSIS — A499 Bacterial infection, unspecified: Secondary | ICD-10-CM | POA: Diagnosis not present

## 2011-12-15 DIAGNOSIS — R5383 Other fatigue: Secondary | ICD-10-CM | POA: Diagnosis not present

## 2011-12-15 DIAGNOSIS — I10 Essential (primary) hypertension: Secondary | ICD-10-CM | POA: Diagnosis not present

## 2011-12-15 DIAGNOSIS — N390 Urinary tract infection, site not specified: Secondary | ICD-10-CM | POA: Diagnosis not present

## 2011-12-15 DIAGNOSIS — R5381 Other malaise: Secondary | ICD-10-CM | POA: Diagnosis not present

## 2011-12-17 DIAGNOSIS — N318 Other neuromuscular dysfunction of bladder: Secondary | ICD-10-CM | POA: Diagnosis not present

## 2011-12-17 DIAGNOSIS — N390 Urinary tract infection, site not specified: Secondary | ICD-10-CM | POA: Diagnosis not present

## 2011-12-17 DIAGNOSIS — R339 Retention of urine, unspecified: Secondary | ICD-10-CM | POA: Diagnosis not present

## 2011-12-28 DIAGNOSIS — R509 Fever, unspecified: Secondary | ICD-10-CM | POA: Diagnosis not present

## 2011-12-28 DIAGNOSIS — I10 Essential (primary) hypertension: Secondary | ICD-10-CM | POA: Diagnosis not present

## 2011-12-28 DIAGNOSIS — A499 Bacterial infection, unspecified: Secondary | ICD-10-CM | POA: Diagnosis not present

## 2011-12-28 DIAGNOSIS — N390 Urinary tract infection, site not specified: Secondary | ICD-10-CM | POA: Diagnosis not present

## 2011-12-29 DIAGNOSIS — F200 Paranoid schizophrenia: Secondary | ICD-10-CM | POA: Diagnosis not present

## 2012-01-01 DIAGNOSIS — N390 Urinary tract infection, site not specified: Secondary | ICD-10-CM | POA: Diagnosis not present

## 2012-01-01 DIAGNOSIS — N312 Flaccid neuropathic bladder, not elsewhere classified: Secondary | ICD-10-CM | POA: Diagnosis not present

## 2012-01-01 DIAGNOSIS — N401 Enlarged prostate with lower urinary tract symptoms: Secondary | ICD-10-CM | POA: Diagnosis not present

## 2012-01-01 DIAGNOSIS — R339 Retention of urine, unspecified: Secondary | ICD-10-CM | POA: Diagnosis not present

## 2012-01-06 DIAGNOSIS — N400 Enlarged prostate without lower urinary tract symptoms: Secondary | ICD-10-CM | POA: Diagnosis not present

## 2012-01-06 DIAGNOSIS — Z79899 Other long term (current) drug therapy: Secondary | ICD-10-CM | POA: Diagnosis not present

## 2012-01-06 DIAGNOSIS — Z7982 Long term (current) use of aspirin: Secondary | ICD-10-CM | POA: Diagnosis not present

## 2012-01-06 DIAGNOSIS — I10 Essential (primary) hypertension: Secondary | ICD-10-CM | POA: Diagnosis not present

## 2012-01-06 DIAGNOSIS — G40909 Epilepsy, unspecified, not intractable, without status epilepticus: Secondary | ICD-10-CM | POA: Diagnosis not present

## 2012-01-06 DIAGNOSIS — N318 Other neuromuscular dysfunction of bladder: Secondary | ICD-10-CM | POA: Diagnosis not present

## 2012-01-06 DIAGNOSIS — N401 Enlarged prostate with lower urinary tract symptoms: Secondary | ICD-10-CM | POA: Diagnosis not present

## 2012-01-06 DIAGNOSIS — I495 Sick sinus syndrome: Secondary | ICD-10-CM | POA: Diagnosis not present

## 2012-01-06 DIAGNOSIS — N411 Chronic prostatitis: Secondary | ICD-10-CM | POA: Diagnosis not present

## 2012-01-06 DIAGNOSIS — R339 Retention of urine, unspecified: Secondary | ICD-10-CM | POA: Diagnosis not present

## 2012-01-11 DIAGNOSIS — R339 Retention of urine, unspecified: Secondary | ICD-10-CM | POA: Diagnosis not present

## 2012-01-11 DIAGNOSIS — N401 Enlarged prostate with lower urinary tract symptoms: Secondary | ICD-10-CM | POA: Diagnosis not present

## 2012-01-11 DIAGNOSIS — N390 Urinary tract infection, site not specified: Secondary | ICD-10-CM | POA: Diagnosis not present

## 2012-01-19 DIAGNOSIS — N390 Urinary tract infection, site not specified: Secondary | ICD-10-CM | POA: Diagnosis not present

## 2012-01-19 DIAGNOSIS — R339 Retention of urine, unspecified: Secondary | ICD-10-CM | POA: Diagnosis not present

## 2012-01-19 DIAGNOSIS — N401 Enlarged prostate with lower urinary tract symptoms: Secondary | ICD-10-CM | POA: Diagnosis not present

## 2012-01-19 DIAGNOSIS — N318 Other neuromuscular dysfunction of bladder: Secondary | ICD-10-CM | POA: Diagnosis not present

## 2012-01-21 DIAGNOSIS — N390 Urinary tract infection, site not specified: Secondary | ICD-10-CM | POA: Diagnosis not present

## 2012-01-21 DIAGNOSIS — G92 Toxic encephalopathy: Secondary | ICD-10-CM | POA: Diagnosis not present

## 2012-01-21 DIAGNOSIS — R262 Difficulty in walking, not elsewhere classified: Secondary | ICD-10-CM | POA: Diagnosis not present

## 2012-01-21 DIAGNOSIS — E860 Dehydration: Secondary | ICD-10-CM | POA: Diagnosis not present

## 2012-01-21 DIAGNOSIS — R5383 Other fatigue: Secondary | ICD-10-CM | POA: Diagnosis not present

## 2012-01-21 DIAGNOSIS — K121 Other forms of stomatitis: Secondary | ICD-10-CM | POA: Diagnosis not present

## 2012-01-21 DIAGNOSIS — R5381 Other malaise: Secondary | ICD-10-CM | POA: Diagnosis not present

## 2012-01-21 DIAGNOSIS — E871 Hypo-osmolality and hyponatremia: Secondary | ICD-10-CM | POA: Diagnosis not present

## 2012-01-21 DIAGNOSIS — B957 Other staphylococcus as the cause of diseases classified elsewhere: Secondary | ICD-10-CM | POA: Diagnosis not present

## 2012-01-21 DIAGNOSIS — R1310 Dysphagia, unspecified: Secondary | ICD-10-CM | POA: Diagnosis not present

## 2012-01-22 DIAGNOSIS — E871 Hypo-osmolality and hyponatremia: Secondary | ICD-10-CM | POA: Diagnosis present

## 2012-01-22 DIAGNOSIS — Z9889 Other specified postprocedural states: Secondary | ICD-10-CM | POA: Diagnosis not present

## 2012-01-22 DIAGNOSIS — R5381 Other malaise: Secondary | ICD-10-CM | POA: Diagnosis present

## 2012-01-22 DIAGNOSIS — E039 Hypothyroidism, unspecified: Secondary | ICD-10-CM | POA: Diagnosis present

## 2012-01-22 DIAGNOSIS — K219 Gastro-esophageal reflux disease without esophagitis: Secondary | ICD-10-CM | POA: Diagnosis present

## 2012-01-22 DIAGNOSIS — G929 Unspecified toxic encephalopathy: Secondary | ICD-10-CM | POA: Diagnosis not present

## 2012-01-22 DIAGNOSIS — K121 Other forms of stomatitis: Secondary | ICD-10-CM | POA: Diagnosis present

## 2012-01-22 DIAGNOSIS — I10 Essential (primary) hypertension: Secondary | ICD-10-CM | POA: Diagnosis present

## 2012-01-22 DIAGNOSIS — R5383 Other fatigue: Secondary | ICD-10-CM | POA: Diagnosis not present

## 2012-01-22 DIAGNOSIS — R1310 Dysphagia, unspecified: Secondary | ICD-10-CM | POA: Diagnosis present

## 2012-01-22 DIAGNOSIS — R262 Difficulty in walking, not elsewhere classified: Secondary | ICD-10-CM | POA: Diagnosis not present

## 2012-01-22 DIAGNOSIS — B957 Other staphylococcus as the cause of diseases classified elsewhere: Secondary | ICD-10-CM | POA: Diagnosis present

## 2012-01-22 DIAGNOSIS — E876 Hypokalemia: Secondary | ICD-10-CM | POA: Diagnosis present

## 2012-01-22 DIAGNOSIS — N390 Urinary tract infection, site not specified: Secondary | ICD-10-CM | POA: Diagnosis not present

## 2012-01-22 DIAGNOSIS — G92 Toxic encephalopathy: Secondary | ICD-10-CM | POA: Diagnosis not present

## 2012-01-22 DIAGNOSIS — F209 Schizophrenia, unspecified: Secondary | ICD-10-CM | POA: Diagnosis present

## 2012-01-23 DIAGNOSIS — N390 Urinary tract infection, site not specified: Secondary | ICD-10-CM | POA: Diagnosis not present

## 2012-01-23 DIAGNOSIS — G92 Toxic encephalopathy: Secondary | ICD-10-CM | POA: Diagnosis not present

## 2012-01-23 DIAGNOSIS — R5381 Other malaise: Secondary | ICD-10-CM | POA: Diagnosis not present

## 2012-01-23 DIAGNOSIS — R262 Difficulty in walking, not elsewhere classified: Secondary | ICD-10-CM | POA: Diagnosis not present

## 2012-01-28 DIAGNOSIS — I10 Essential (primary) hypertension: Secondary | ICD-10-CM | POA: Diagnosis not present

## 2012-01-28 DIAGNOSIS — G4700 Insomnia, unspecified: Secondary | ICD-10-CM | POA: Diagnosis not present

## 2012-01-28 DIAGNOSIS — N400 Enlarged prostate without lower urinary tract symptoms: Secondary | ICD-10-CM | POA: Diagnosis not present

## 2012-01-31 DIAGNOSIS — E039 Hypothyroidism, unspecified: Secondary | ICD-10-CM | POA: Diagnosis not present

## 2012-01-31 DIAGNOSIS — R569 Unspecified convulsions: Secondary | ICD-10-CM | POA: Diagnosis not present

## 2012-01-31 DIAGNOSIS — E7800 Pure hypercholesterolemia, unspecified: Secondary | ICD-10-CM | POA: Diagnosis not present

## 2012-01-31 DIAGNOSIS — J449 Chronic obstructive pulmonary disease, unspecified: Secondary | ICD-10-CM | POA: Diagnosis not present

## 2012-01-31 DIAGNOSIS — E871 Hypo-osmolality and hyponatremia: Secondary | ICD-10-CM | POA: Diagnosis not present

## 2012-01-31 DIAGNOSIS — F29 Unspecified psychosis not due to a substance or known physiological condition: Secondary | ICD-10-CM | POA: Diagnosis not present

## 2012-01-31 DIAGNOSIS — R4182 Altered mental status, unspecified: Secondary | ICD-10-CM | POA: Diagnosis not present

## 2012-01-31 DIAGNOSIS — F209 Schizophrenia, unspecified: Secondary | ICD-10-CM | POA: Diagnosis not present

## 2012-01-31 DIAGNOSIS — Z79899 Other long term (current) drug therapy: Secondary | ICD-10-CM | POA: Diagnosis not present

## 2012-01-31 DIAGNOSIS — I10 Essential (primary) hypertension: Secondary | ICD-10-CM | POA: Diagnosis not present

## 2012-02-02 DIAGNOSIS — I10 Essential (primary) hypertension: Secondary | ICD-10-CM | POA: Diagnosis not present

## 2012-02-02 DIAGNOSIS — R339 Retention of urine, unspecified: Secondary | ICD-10-CM | POA: Diagnosis not present

## 2012-02-02 DIAGNOSIS — N401 Enlarged prostate with lower urinary tract symptoms: Secondary | ICD-10-CM | POA: Diagnosis not present

## 2012-02-02 DIAGNOSIS — N318 Other neuromuscular dysfunction of bladder: Secondary | ICD-10-CM | POA: Diagnosis not present

## 2012-02-02 DIAGNOSIS — N400 Enlarged prostate without lower urinary tract symptoms: Secondary | ICD-10-CM | POA: Diagnosis not present

## 2012-02-02 DIAGNOSIS — N390 Urinary tract infection, site not specified: Secondary | ICD-10-CM | POA: Diagnosis not present

## 2012-02-02 DIAGNOSIS — G4700 Insomnia, unspecified: Secondary | ICD-10-CM | POA: Diagnosis not present

## 2012-02-10 DIAGNOSIS — I10 Essential (primary) hypertension: Secondary | ICD-10-CM | POA: Diagnosis not present

## 2012-02-10 DIAGNOSIS — G4700 Insomnia, unspecified: Secondary | ICD-10-CM | POA: Diagnosis not present

## 2012-02-10 DIAGNOSIS — N400 Enlarged prostate without lower urinary tract symptoms: Secondary | ICD-10-CM | POA: Diagnosis not present

## 2012-02-17 DIAGNOSIS — N312 Flaccid neuropathic bladder, not elsewhere classified: Secondary | ICD-10-CM | POA: Diagnosis not present

## 2012-02-17 DIAGNOSIS — N390 Urinary tract infection, site not specified: Secondary | ICD-10-CM | POA: Diagnosis not present

## 2012-02-17 DIAGNOSIS — N318 Other neuromuscular dysfunction of bladder: Secondary | ICD-10-CM | POA: Diagnosis not present

## 2012-02-17 DIAGNOSIS — R339 Retention of urine, unspecified: Secondary | ICD-10-CM | POA: Diagnosis not present

## 2012-03-01 DIAGNOSIS — H3530 Unspecified macular degeneration: Secondary | ICD-10-CM | POA: Diagnosis not present

## 2012-03-01 DIAGNOSIS — H04129 Dry eye syndrome of unspecified lacrimal gland: Secondary | ICD-10-CM | POA: Diagnosis not present

## 2012-03-10 DIAGNOSIS — N390 Urinary tract infection, site not specified: Secondary | ICD-10-CM | POA: Diagnosis not present

## 2012-03-10 DIAGNOSIS — N318 Other neuromuscular dysfunction of bladder: Secondary | ICD-10-CM | POA: Diagnosis not present

## 2012-03-10 DIAGNOSIS — R339 Retention of urine, unspecified: Secondary | ICD-10-CM | POA: Diagnosis not present

## 2012-03-12 DIAGNOSIS — N390 Urinary tract infection, site not specified: Secondary | ICD-10-CM | POA: Diagnosis not present

## 2012-03-25 DIAGNOSIS — N312 Flaccid neuropathic bladder, not elsewhere classified: Secondary | ICD-10-CM | POA: Diagnosis not present

## 2012-03-25 DIAGNOSIS — N390 Urinary tract infection, site not specified: Secondary | ICD-10-CM | POA: Diagnosis not present

## 2012-03-25 DIAGNOSIS — R339 Retention of urine, unspecified: Secondary | ICD-10-CM | POA: Diagnosis not present

## 2012-03-25 DIAGNOSIS — N318 Other neuromuscular dysfunction of bladder: Secondary | ICD-10-CM | POA: Diagnosis not present

## 2012-04-14 DIAGNOSIS — Z23 Encounter for immunization: Secondary | ICD-10-CM | POA: Diagnosis not present

## 2012-04-14 DIAGNOSIS — G4700 Insomnia, unspecified: Secondary | ICD-10-CM | POA: Diagnosis not present

## 2012-04-14 DIAGNOSIS — N400 Enlarged prostate without lower urinary tract symptoms: Secondary | ICD-10-CM | POA: Diagnosis not present

## 2012-04-14 DIAGNOSIS — I10 Essential (primary) hypertension: Secondary | ICD-10-CM | POA: Diagnosis not present

## 2012-04-26 DIAGNOSIS — F200 Paranoid schizophrenia: Secondary | ICD-10-CM | POA: Diagnosis not present

## 2012-05-05 DIAGNOSIS — N390 Urinary tract infection, site not specified: Secondary | ICD-10-CM | POA: Diagnosis not present

## 2012-05-05 DIAGNOSIS — R339 Retention of urine, unspecified: Secondary | ICD-10-CM | POA: Diagnosis not present

## 2012-05-05 DIAGNOSIS — N318 Other neuromuscular dysfunction of bladder: Secondary | ICD-10-CM | POA: Diagnosis not present

## 2012-05-06 DIAGNOSIS — R339 Retention of urine, unspecified: Secondary | ICD-10-CM | POA: Diagnosis not present

## 2012-05-06 DIAGNOSIS — N401 Enlarged prostate with lower urinary tract symptoms: Secondary | ICD-10-CM | POA: Diagnosis not present

## 2012-05-06 DIAGNOSIS — I10 Essential (primary) hypertension: Secondary | ICD-10-CM | POA: Diagnosis not present

## 2012-05-06 DIAGNOSIS — N138 Other obstructive and reflux uropathy: Secondary | ICD-10-CM | POA: Diagnosis not present

## 2012-05-06 DIAGNOSIS — N400 Enlarged prostate without lower urinary tract symptoms: Secondary | ICD-10-CM | POA: Diagnosis not present

## 2012-05-06 DIAGNOSIS — F209 Schizophrenia, unspecified: Secondary | ICD-10-CM | POA: Diagnosis not present

## 2012-05-06 DIAGNOSIS — N390 Urinary tract infection, site not specified: Secondary | ICD-10-CM | POA: Diagnosis not present

## 2012-05-10 DIAGNOSIS — N390 Urinary tract infection, site not specified: Secondary | ICD-10-CM | POA: Diagnosis not present

## 2012-05-10 DIAGNOSIS — N138 Other obstructive and reflux uropathy: Secondary | ICD-10-CM | POA: Diagnosis not present

## 2012-05-10 DIAGNOSIS — R339 Retention of urine, unspecified: Secondary | ICD-10-CM | POA: Diagnosis not present

## 2012-05-10 DIAGNOSIS — I10 Essential (primary) hypertension: Secondary | ICD-10-CM | POA: Diagnosis not present

## 2012-05-10 DIAGNOSIS — F209 Schizophrenia, unspecified: Secondary | ICD-10-CM | POA: Diagnosis not present

## 2012-05-12 DIAGNOSIS — N390 Urinary tract infection, site not specified: Secondary | ICD-10-CM | POA: Diagnosis not present

## 2012-05-12 DIAGNOSIS — N401 Enlarged prostate with lower urinary tract symptoms: Secondary | ICD-10-CM | POA: Diagnosis not present

## 2012-05-12 DIAGNOSIS — F209 Schizophrenia, unspecified: Secondary | ICD-10-CM | POA: Diagnosis not present

## 2012-05-12 DIAGNOSIS — I10 Essential (primary) hypertension: Secondary | ICD-10-CM | POA: Diagnosis not present

## 2012-05-12 DIAGNOSIS — R339 Retention of urine, unspecified: Secondary | ICD-10-CM | POA: Diagnosis not present

## 2012-05-16 DIAGNOSIS — R339 Retention of urine, unspecified: Secondary | ICD-10-CM | POA: Diagnosis not present

## 2012-05-16 DIAGNOSIS — N390 Urinary tract infection, site not specified: Secondary | ICD-10-CM | POA: Diagnosis not present

## 2012-05-16 DIAGNOSIS — I10 Essential (primary) hypertension: Secondary | ICD-10-CM | POA: Diagnosis not present

## 2012-05-16 DIAGNOSIS — N401 Enlarged prostate with lower urinary tract symptoms: Secondary | ICD-10-CM | POA: Diagnosis not present

## 2012-05-16 DIAGNOSIS — F209 Schizophrenia, unspecified: Secondary | ICD-10-CM | POA: Diagnosis not present

## 2012-05-19 DIAGNOSIS — R339 Retention of urine, unspecified: Secondary | ICD-10-CM | POA: Diagnosis not present

## 2012-05-19 DIAGNOSIS — F209 Schizophrenia, unspecified: Secondary | ICD-10-CM | POA: Diagnosis not present

## 2012-05-19 DIAGNOSIS — N138 Other obstructive and reflux uropathy: Secondary | ICD-10-CM | POA: Diagnosis not present

## 2012-05-19 DIAGNOSIS — I10 Essential (primary) hypertension: Secondary | ICD-10-CM | POA: Diagnosis not present

## 2012-05-19 DIAGNOSIS — N312 Flaccid neuropathic bladder, not elsewhere classified: Secondary | ICD-10-CM | POA: Diagnosis not present

## 2012-05-19 DIAGNOSIS — N390 Urinary tract infection, site not specified: Secondary | ICD-10-CM | POA: Diagnosis not present

## 2012-05-20 DIAGNOSIS — F209 Schizophrenia, unspecified: Secondary | ICD-10-CM | POA: Diagnosis not present

## 2012-05-20 DIAGNOSIS — R339 Retention of urine, unspecified: Secondary | ICD-10-CM | POA: Diagnosis not present

## 2012-05-20 DIAGNOSIS — I10 Essential (primary) hypertension: Secondary | ICD-10-CM | POA: Diagnosis not present

## 2012-05-20 DIAGNOSIS — N390 Urinary tract infection, site not specified: Secondary | ICD-10-CM | POA: Diagnosis not present

## 2012-05-20 DIAGNOSIS — N138 Other obstructive and reflux uropathy: Secondary | ICD-10-CM | POA: Diagnosis not present

## 2012-05-26 DIAGNOSIS — F209 Schizophrenia, unspecified: Secondary | ICD-10-CM | POA: Diagnosis not present

## 2012-05-26 DIAGNOSIS — R339 Retention of urine, unspecified: Secondary | ICD-10-CM | POA: Diagnosis not present

## 2012-05-26 DIAGNOSIS — I10 Essential (primary) hypertension: Secondary | ICD-10-CM | POA: Diagnosis not present

## 2012-05-26 DIAGNOSIS — N390 Urinary tract infection, site not specified: Secondary | ICD-10-CM | POA: Diagnosis not present

## 2012-05-26 DIAGNOSIS — N138 Other obstructive and reflux uropathy: Secondary | ICD-10-CM | POA: Diagnosis not present

## 2012-06-02 DIAGNOSIS — N390 Urinary tract infection, site not specified: Secondary | ICD-10-CM | POA: Diagnosis not present

## 2012-06-02 DIAGNOSIS — R339 Retention of urine, unspecified: Secondary | ICD-10-CM | POA: Diagnosis not present

## 2012-06-02 DIAGNOSIS — N312 Flaccid neuropathic bladder, not elsewhere classified: Secondary | ICD-10-CM | POA: Diagnosis not present

## 2012-06-06 DIAGNOSIS — N390 Urinary tract infection, site not specified: Secondary | ICD-10-CM | POA: Diagnosis not present

## 2012-06-14 DIAGNOSIS — G4700 Insomnia, unspecified: Secondary | ICD-10-CM | POA: Diagnosis not present

## 2012-06-14 DIAGNOSIS — E039 Hypothyroidism, unspecified: Secondary | ICD-10-CM | POA: Diagnosis not present

## 2012-06-14 DIAGNOSIS — N400 Enlarged prostate without lower urinary tract symptoms: Secondary | ICD-10-CM | POA: Diagnosis not present

## 2012-06-14 DIAGNOSIS — I10 Essential (primary) hypertension: Secondary | ICD-10-CM | POA: Diagnosis not present

## 2012-06-16 DIAGNOSIS — N390 Urinary tract infection, site not specified: Secondary | ICD-10-CM | POA: Diagnosis not present

## 2012-06-16 DIAGNOSIS — R339 Retention of urine, unspecified: Secondary | ICD-10-CM | POA: Diagnosis not present

## 2012-06-16 DIAGNOSIS — F209 Schizophrenia, unspecified: Secondary | ICD-10-CM | POA: Diagnosis not present

## 2012-06-16 DIAGNOSIS — I10 Essential (primary) hypertension: Secondary | ICD-10-CM | POA: Diagnosis not present

## 2012-06-16 DIAGNOSIS — N401 Enlarged prostate with lower urinary tract symptoms: Secondary | ICD-10-CM | POA: Diagnosis not present

## 2012-06-24 DIAGNOSIS — N390 Urinary tract infection, site not specified: Secondary | ICD-10-CM | POA: Diagnosis not present

## 2012-06-24 DIAGNOSIS — I10 Essential (primary) hypertension: Secondary | ICD-10-CM | POA: Diagnosis not present

## 2012-06-24 DIAGNOSIS — R339 Retention of urine, unspecified: Secondary | ICD-10-CM | POA: Diagnosis not present

## 2012-06-24 DIAGNOSIS — N401 Enlarged prostate with lower urinary tract symptoms: Secondary | ICD-10-CM | POA: Diagnosis not present

## 2012-06-24 DIAGNOSIS — F209 Schizophrenia, unspecified: Secondary | ICD-10-CM | POA: Diagnosis not present

## 2012-06-30 DIAGNOSIS — R339 Retention of urine, unspecified: Secondary | ICD-10-CM | POA: Diagnosis not present

## 2012-06-30 DIAGNOSIS — N390 Urinary tract infection, site not specified: Secondary | ICD-10-CM | POA: Diagnosis not present

## 2012-06-30 DIAGNOSIS — I10 Essential (primary) hypertension: Secondary | ICD-10-CM | POA: Diagnosis not present

## 2012-06-30 DIAGNOSIS — F209 Schizophrenia, unspecified: Secondary | ICD-10-CM | POA: Diagnosis not present

## 2012-06-30 DIAGNOSIS — N401 Enlarged prostate with lower urinary tract symptoms: Secondary | ICD-10-CM | POA: Diagnosis not present

## 2012-07-05 DIAGNOSIS — R319 Hematuria, unspecified: Secondary | ICD-10-CM | POA: Diagnosis not present

## 2012-07-05 DIAGNOSIS — N138 Other obstructive and reflux uropathy: Secondary | ICD-10-CM | POA: Diagnosis not present

## 2012-07-05 DIAGNOSIS — R339 Retention of urine, unspecified: Secondary | ICD-10-CM | POA: Diagnosis not present

## 2012-07-05 DIAGNOSIS — D414 Neoplasm of uncertain behavior of bladder: Secondary | ICD-10-CM | POA: Diagnosis not present

## 2012-07-05 DIAGNOSIS — C673 Malignant neoplasm of anterior wall of bladder: Secondary | ICD-10-CM | POA: Diagnosis not present

## 2012-07-05 DIAGNOSIS — N312 Flaccid neuropathic bladder, not elsewhere classified: Secondary | ICD-10-CM | POA: Diagnosis not present

## 2012-07-05 DIAGNOSIS — I10 Essential (primary) hypertension: Secondary | ICD-10-CM | POA: Diagnosis not present

## 2012-07-05 DIAGNOSIS — F209 Schizophrenia, unspecified: Secondary | ICD-10-CM | POA: Diagnosis not present

## 2012-07-05 DIAGNOSIS — N390 Urinary tract infection, site not specified: Secondary | ICD-10-CM | POA: Diagnosis not present

## 2012-07-05 DIAGNOSIS — D494 Neoplasm of unspecified behavior of bladder: Secondary | ICD-10-CM | POA: Diagnosis not present

## 2012-07-09 DIAGNOSIS — T83091A Other mechanical complication of indwelling urethral catheter, initial encounter: Secondary | ICD-10-CM | POA: Diagnosis not present

## 2012-07-09 DIAGNOSIS — S3720XA Unspecified injury of bladder, initial encounter: Secondary | ICD-10-CM | POA: Diagnosis not present

## 2012-07-09 DIAGNOSIS — N390 Urinary tract infection, site not specified: Secondary | ICD-10-CM | POA: Diagnosis not present

## 2012-07-09 DIAGNOSIS — F209 Schizophrenia, unspecified: Secondary | ICD-10-CM | POA: Diagnosis not present

## 2012-07-09 DIAGNOSIS — Y838 Other surgical procedures as the cause of abnormal reaction of the patient, or of later complication, without mention of misadventure at the time of the procedure: Secondary | ICD-10-CM | POA: Diagnosis not present

## 2012-07-09 DIAGNOSIS — R339 Retention of urine, unspecified: Secondary | ICD-10-CM | POA: Diagnosis not present

## 2012-07-09 DIAGNOSIS — N138 Other obstructive and reflux uropathy: Secondary | ICD-10-CM | POA: Diagnosis not present

## 2012-07-09 DIAGNOSIS — I10 Essential (primary) hypertension: Secondary | ICD-10-CM | POA: Diagnosis not present

## 2012-07-11 DIAGNOSIS — I10 Essential (primary) hypertension: Secondary | ICD-10-CM | POA: Diagnosis not present

## 2012-07-11 DIAGNOSIS — N390 Urinary tract infection, site not specified: Secondary | ICD-10-CM | POA: Diagnosis not present

## 2012-07-11 DIAGNOSIS — N401 Enlarged prostate with lower urinary tract symptoms: Secondary | ICD-10-CM | POA: Diagnosis not present

## 2012-07-11 DIAGNOSIS — R339 Retention of urine, unspecified: Secondary | ICD-10-CM | POA: Diagnosis not present

## 2012-07-11 DIAGNOSIS — F209 Schizophrenia, unspecified: Secondary | ICD-10-CM | POA: Diagnosis not present

## 2012-07-13 DIAGNOSIS — C679 Malignant neoplasm of bladder, unspecified: Secondary | ICD-10-CM | POA: Diagnosis not present

## 2012-07-13 DIAGNOSIS — Z79899 Other long term (current) drug therapy: Secondary | ICD-10-CM | POA: Diagnosis not present

## 2012-07-13 DIAGNOSIS — Z7982 Long term (current) use of aspirin: Secondary | ICD-10-CM | POA: Diagnosis not present

## 2012-07-13 DIAGNOSIS — Z0389 Encounter for observation for other suspected diseases and conditions ruled out: Secondary | ICD-10-CM | POA: Diagnosis not present

## 2012-07-13 DIAGNOSIS — N3090 Cystitis, unspecified without hematuria: Secondary | ICD-10-CM | POA: Diagnosis not present

## 2012-07-13 DIAGNOSIS — D414 Neoplasm of uncertain behavior of bladder: Secondary | ICD-10-CM | POA: Diagnosis not present

## 2012-07-13 DIAGNOSIS — E039 Hypothyroidism, unspecified: Secondary | ICD-10-CM | POA: Diagnosis not present

## 2012-07-13 DIAGNOSIS — N390 Urinary tract infection, site not specified: Secondary | ICD-10-CM | POA: Diagnosis not present

## 2012-07-14 DIAGNOSIS — N390 Urinary tract infection, site not specified: Secondary | ICD-10-CM | POA: Diagnosis not present

## 2012-07-14 DIAGNOSIS — Z79899 Other long term (current) drug therapy: Secondary | ICD-10-CM | POA: Diagnosis not present

## 2012-07-14 DIAGNOSIS — Z7982 Long term (current) use of aspirin: Secondary | ICD-10-CM | POA: Diagnosis not present

## 2012-07-14 DIAGNOSIS — D414 Neoplasm of uncertain behavior of bladder: Secondary | ICD-10-CM | POA: Diagnosis not present

## 2012-07-15 DIAGNOSIS — Z79899 Other long term (current) drug therapy: Secondary | ICD-10-CM | POA: Diagnosis not present

## 2012-07-15 DIAGNOSIS — Z7982 Long term (current) use of aspirin: Secondary | ICD-10-CM | POA: Diagnosis not present

## 2012-07-15 DIAGNOSIS — D414 Neoplasm of uncertain behavior of bladder: Secondary | ICD-10-CM | POA: Diagnosis not present

## 2012-07-15 DIAGNOSIS — N390 Urinary tract infection, site not specified: Secondary | ICD-10-CM | POA: Diagnosis not present

## 2012-07-16 DIAGNOSIS — Z79899 Other long term (current) drug therapy: Secondary | ICD-10-CM | POA: Diagnosis not present

## 2012-07-16 DIAGNOSIS — Z7982 Long term (current) use of aspirin: Secondary | ICD-10-CM | POA: Diagnosis not present

## 2012-07-16 DIAGNOSIS — N390 Urinary tract infection, site not specified: Secondary | ICD-10-CM | POA: Diagnosis not present

## 2012-07-16 DIAGNOSIS — D414 Neoplasm of uncertain behavior of bladder: Secondary | ICD-10-CM | POA: Diagnosis not present

## 2012-07-16 DIAGNOSIS — N138 Other obstructive and reflux uropathy: Secondary | ICD-10-CM | POA: Diagnosis not present

## 2012-07-16 DIAGNOSIS — F209 Schizophrenia, unspecified: Secondary | ICD-10-CM | POA: Diagnosis not present

## 2012-07-16 DIAGNOSIS — R339 Retention of urine, unspecified: Secondary | ICD-10-CM | POA: Diagnosis not present

## 2012-07-16 DIAGNOSIS — I10 Essential (primary) hypertension: Secondary | ICD-10-CM | POA: Diagnosis not present

## 2012-07-18 DIAGNOSIS — N312 Flaccid neuropathic bladder, not elsewhere classified: Secondary | ICD-10-CM | POA: Diagnosis not present

## 2012-07-18 DIAGNOSIS — N390 Urinary tract infection, site not specified: Secondary | ICD-10-CM | POA: Diagnosis not present

## 2012-07-18 DIAGNOSIS — N318 Other neuromuscular dysfunction of bladder: Secondary | ICD-10-CM | POA: Diagnosis not present

## 2012-07-18 DIAGNOSIS — R339 Retention of urine, unspecified: Secondary | ICD-10-CM | POA: Diagnosis not present

## 2012-07-19 DIAGNOSIS — N401 Enlarged prostate with lower urinary tract symptoms: Secondary | ICD-10-CM | POA: Diagnosis not present

## 2012-07-19 DIAGNOSIS — F209 Schizophrenia, unspecified: Secondary | ICD-10-CM | POA: Diagnosis not present

## 2012-07-19 DIAGNOSIS — I10 Essential (primary) hypertension: Secondary | ICD-10-CM | POA: Diagnosis not present

## 2012-07-19 DIAGNOSIS — R339 Retention of urine, unspecified: Secondary | ICD-10-CM | POA: Diagnosis not present

## 2012-07-19 DIAGNOSIS — N390 Urinary tract infection, site not specified: Secondary | ICD-10-CM | POA: Diagnosis not present

## 2012-07-21 DIAGNOSIS — F209 Schizophrenia, unspecified: Secondary | ICD-10-CM | POA: Diagnosis not present

## 2012-07-21 DIAGNOSIS — N390 Urinary tract infection, site not specified: Secondary | ICD-10-CM | POA: Diagnosis not present

## 2012-07-21 DIAGNOSIS — R339 Retention of urine, unspecified: Secondary | ICD-10-CM | POA: Diagnosis not present

## 2012-07-21 DIAGNOSIS — I10 Essential (primary) hypertension: Secondary | ICD-10-CM | POA: Diagnosis not present

## 2012-07-21 DIAGNOSIS — N138 Other obstructive and reflux uropathy: Secondary | ICD-10-CM | POA: Diagnosis not present

## 2012-07-27 DIAGNOSIS — N390 Urinary tract infection, site not specified: Secondary | ICD-10-CM | POA: Diagnosis not present

## 2012-07-27 DIAGNOSIS — F209 Schizophrenia, unspecified: Secondary | ICD-10-CM | POA: Diagnosis not present

## 2012-07-27 DIAGNOSIS — I10 Essential (primary) hypertension: Secondary | ICD-10-CM | POA: Diagnosis not present

## 2012-07-27 DIAGNOSIS — R339 Retention of urine, unspecified: Secondary | ICD-10-CM | POA: Diagnosis not present

## 2012-07-27 DIAGNOSIS — N401 Enlarged prostate with lower urinary tract symptoms: Secondary | ICD-10-CM | POA: Diagnosis not present

## 2012-07-28 DIAGNOSIS — R339 Retention of urine, unspecified: Secondary | ICD-10-CM | POA: Diagnosis not present

## 2012-07-28 DIAGNOSIS — N318 Other neuromuscular dysfunction of bladder: Secondary | ICD-10-CM | POA: Diagnosis not present

## 2012-07-28 DIAGNOSIS — N390 Urinary tract infection, site not specified: Secondary | ICD-10-CM | POA: Diagnosis not present

## 2012-07-28 DIAGNOSIS — N312 Flaccid neuropathic bladder, not elsewhere classified: Secondary | ICD-10-CM | POA: Diagnosis not present

## 2012-08-03 DIAGNOSIS — N401 Enlarged prostate with lower urinary tract symptoms: Secondary | ICD-10-CM | POA: Diagnosis not present

## 2012-08-03 DIAGNOSIS — R339 Retention of urine, unspecified: Secondary | ICD-10-CM | POA: Diagnosis not present

## 2012-08-03 DIAGNOSIS — I10 Essential (primary) hypertension: Secondary | ICD-10-CM | POA: Diagnosis not present

## 2012-08-03 DIAGNOSIS — F209 Schizophrenia, unspecified: Secondary | ICD-10-CM | POA: Diagnosis not present

## 2012-08-03 DIAGNOSIS — N390 Urinary tract infection, site not specified: Secondary | ICD-10-CM | POA: Diagnosis not present

## 2012-08-10 DIAGNOSIS — R339 Retention of urine, unspecified: Secondary | ICD-10-CM | POA: Diagnosis not present

## 2012-08-10 DIAGNOSIS — F209 Schizophrenia, unspecified: Secondary | ICD-10-CM | POA: Diagnosis not present

## 2012-08-10 DIAGNOSIS — I10 Essential (primary) hypertension: Secondary | ICD-10-CM | POA: Diagnosis not present

## 2012-08-10 DIAGNOSIS — N138 Other obstructive and reflux uropathy: Secondary | ICD-10-CM | POA: Diagnosis not present

## 2012-08-10 DIAGNOSIS — N390 Urinary tract infection, site not specified: Secondary | ICD-10-CM | POA: Diagnosis not present

## 2012-08-16 DIAGNOSIS — I10 Essential (primary) hypertension: Secondary | ICD-10-CM | POA: Diagnosis not present

## 2012-08-16 DIAGNOSIS — G4700 Insomnia, unspecified: Secondary | ICD-10-CM | POA: Diagnosis not present

## 2012-08-16 DIAGNOSIS — N400 Enlarged prostate without lower urinary tract symptoms: Secondary | ICD-10-CM | POA: Diagnosis not present

## 2012-08-16 DIAGNOSIS — E039 Hypothyroidism, unspecified: Secondary | ICD-10-CM | POA: Diagnosis not present

## 2012-08-17 DIAGNOSIS — I10 Essential (primary) hypertension: Secondary | ICD-10-CM | POA: Diagnosis not present

## 2012-08-17 DIAGNOSIS — N390 Urinary tract infection, site not specified: Secondary | ICD-10-CM | POA: Diagnosis not present

## 2012-08-17 DIAGNOSIS — F209 Schizophrenia, unspecified: Secondary | ICD-10-CM | POA: Diagnosis not present

## 2012-08-17 DIAGNOSIS — N138 Other obstructive and reflux uropathy: Secondary | ICD-10-CM | POA: Diagnosis not present

## 2012-08-17 DIAGNOSIS — R339 Retention of urine, unspecified: Secondary | ICD-10-CM | POA: Diagnosis not present

## 2012-08-23 DIAGNOSIS — F200 Paranoid schizophrenia: Secondary | ICD-10-CM | POA: Diagnosis not present

## 2012-08-29 DIAGNOSIS — R339 Retention of urine, unspecified: Secondary | ICD-10-CM | POA: Diagnosis not present

## 2012-08-29 DIAGNOSIS — N401 Enlarged prostate with lower urinary tract symptoms: Secondary | ICD-10-CM | POA: Diagnosis not present

## 2012-08-29 DIAGNOSIS — I10 Essential (primary) hypertension: Secondary | ICD-10-CM | POA: Diagnosis not present

## 2012-08-29 DIAGNOSIS — N390 Urinary tract infection, site not specified: Secondary | ICD-10-CM | POA: Diagnosis not present

## 2012-08-29 DIAGNOSIS — F209 Schizophrenia, unspecified: Secondary | ICD-10-CM | POA: Diagnosis not present

## 2012-08-30 DIAGNOSIS — H26499 Other secondary cataract, unspecified eye: Secondary | ICD-10-CM | POA: Diagnosis not present

## 2012-09-01 DIAGNOSIS — H26499 Other secondary cataract, unspecified eye: Secondary | ICD-10-CM | POA: Diagnosis not present

## 2012-09-03 DIAGNOSIS — F209 Schizophrenia, unspecified: Secondary | ICD-10-CM | POA: Diagnosis not present

## 2012-09-03 DIAGNOSIS — N138 Other obstructive and reflux uropathy: Secondary | ICD-10-CM | POA: Diagnosis not present

## 2012-09-03 DIAGNOSIS — N390 Urinary tract infection, site not specified: Secondary | ICD-10-CM | POA: Diagnosis not present

## 2012-09-03 DIAGNOSIS — R339 Retention of urine, unspecified: Secondary | ICD-10-CM | POA: Diagnosis not present

## 2012-09-03 DIAGNOSIS — I10 Essential (primary) hypertension: Secondary | ICD-10-CM | POA: Diagnosis not present

## 2012-09-03 DIAGNOSIS — N319 Neuromuscular dysfunction of bladder, unspecified: Secondary | ICD-10-CM | POA: Diagnosis not present

## 2012-09-06 DIAGNOSIS — F209 Schizophrenia, unspecified: Secondary | ICD-10-CM | POA: Diagnosis not present

## 2012-09-06 DIAGNOSIS — R339 Retention of urine, unspecified: Secondary | ICD-10-CM | POA: Diagnosis not present

## 2012-09-06 DIAGNOSIS — N319 Neuromuscular dysfunction of bladder, unspecified: Secondary | ICD-10-CM | POA: Diagnosis not present

## 2012-09-06 DIAGNOSIS — N138 Other obstructive and reflux uropathy: Secondary | ICD-10-CM | POA: Diagnosis not present

## 2012-09-06 DIAGNOSIS — I10 Essential (primary) hypertension: Secondary | ICD-10-CM | POA: Diagnosis not present

## 2012-09-08 DIAGNOSIS — N390 Urinary tract infection, site not specified: Secondary | ICD-10-CM | POA: Diagnosis not present

## 2012-09-08 DIAGNOSIS — N318 Other neuromuscular dysfunction of bladder: Secondary | ICD-10-CM | POA: Diagnosis not present

## 2012-09-08 DIAGNOSIS — Q6239 Other obstructive defects of renal pelvis and ureter: Secondary | ICD-10-CM | POA: Diagnosis not present

## 2012-09-08 DIAGNOSIS — C679 Malignant neoplasm of bladder, unspecified: Secondary | ICD-10-CM | POA: Diagnosis not present

## 2012-09-09 DIAGNOSIS — I10 Essential (primary) hypertension: Secondary | ICD-10-CM | POA: Diagnosis not present

## 2012-09-09 DIAGNOSIS — N319 Neuromuscular dysfunction of bladder, unspecified: Secondary | ICD-10-CM | POA: Diagnosis not present

## 2012-09-09 DIAGNOSIS — F209 Schizophrenia, unspecified: Secondary | ICD-10-CM | POA: Diagnosis not present

## 2012-09-09 DIAGNOSIS — N401 Enlarged prostate with lower urinary tract symptoms: Secondary | ICD-10-CM | POA: Diagnosis not present

## 2012-09-09 DIAGNOSIS — R339 Retention of urine, unspecified: Secondary | ICD-10-CM | POA: Diagnosis not present

## 2012-09-13 DIAGNOSIS — I10 Essential (primary) hypertension: Secondary | ICD-10-CM | POA: Diagnosis not present

## 2012-09-13 DIAGNOSIS — R339 Retention of urine, unspecified: Secondary | ICD-10-CM | POA: Diagnosis not present

## 2012-09-13 DIAGNOSIS — N138 Other obstructive and reflux uropathy: Secondary | ICD-10-CM | POA: Diagnosis not present

## 2012-09-13 DIAGNOSIS — F209 Schizophrenia, unspecified: Secondary | ICD-10-CM | POA: Diagnosis not present

## 2012-09-13 DIAGNOSIS — N319 Neuromuscular dysfunction of bladder, unspecified: Secondary | ICD-10-CM | POA: Diagnosis not present

## 2012-09-14 DIAGNOSIS — N319 Neuromuscular dysfunction of bladder, unspecified: Secondary | ICD-10-CM | POA: Diagnosis not present

## 2012-09-14 DIAGNOSIS — F209 Schizophrenia, unspecified: Secondary | ICD-10-CM | POA: Diagnosis not present

## 2012-09-14 DIAGNOSIS — N138 Other obstructive and reflux uropathy: Secondary | ICD-10-CM | POA: Diagnosis not present

## 2012-09-14 DIAGNOSIS — R339 Retention of urine, unspecified: Secondary | ICD-10-CM | POA: Diagnosis not present

## 2012-09-14 DIAGNOSIS — I10 Essential (primary) hypertension: Secondary | ICD-10-CM | POA: Diagnosis not present

## 2012-09-15 DIAGNOSIS — E860 Dehydration: Secondary | ICD-10-CM | POA: Diagnosis not present

## 2012-09-15 DIAGNOSIS — E876 Hypokalemia: Secondary | ICD-10-CM | POA: Diagnosis present

## 2012-09-15 DIAGNOSIS — A499 Bacterial infection, unspecified: Secondary | ICD-10-CM | POA: Diagnosis not present

## 2012-09-15 DIAGNOSIS — R296 Repeated falls: Secondary | ICD-10-CM | POA: Diagnosis not present

## 2012-09-15 DIAGNOSIS — Z23 Encounter for immunization: Secondary | ICD-10-CM | POA: Diagnosis not present

## 2012-09-15 DIAGNOSIS — N390 Urinary tract infection, site not specified: Secondary | ICD-10-CM | POA: Diagnosis not present

## 2012-09-15 DIAGNOSIS — T1490XA Injury, unspecified, initial encounter: Secondary | ICD-10-CM | POA: Diagnosis not present

## 2012-09-15 DIAGNOSIS — B9689 Other specified bacterial agents as the cause of diseases classified elsewhere: Secondary | ICD-10-CM | POA: Diagnosis not present

## 2012-09-15 DIAGNOSIS — N289 Disorder of kidney and ureter, unspecified: Secondary | ICD-10-CM | POA: Diagnosis present

## 2012-09-15 DIAGNOSIS — G9349 Other encephalopathy: Secondary | ICD-10-CM | POA: Diagnosis not present

## 2012-09-15 DIAGNOSIS — Z9181 History of falling: Secondary | ICD-10-CM | POA: Diagnosis not present

## 2012-09-15 DIAGNOSIS — E039 Hypothyroidism, unspecified: Secondary | ICD-10-CM | POA: Diagnosis present

## 2012-09-15 DIAGNOSIS — N401 Enlarged prostate with lower urinary tract symptoms: Secondary | ICD-10-CM | POA: Diagnosis not present

## 2012-09-15 DIAGNOSIS — T148XXA Other injury of unspecified body region, initial encounter: Secondary | ICD-10-CM | POA: Diagnosis not present

## 2012-09-15 DIAGNOSIS — G929 Unspecified toxic encephalopathy: Secondary | ICD-10-CM | POA: Diagnosis not present

## 2012-09-15 DIAGNOSIS — G92 Toxic encephalopathy: Secondary | ICD-10-CM | POA: Diagnosis present

## 2012-09-15 DIAGNOSIS — M6282 Rhabdomyolysis: Secondary | ICD-10-CM | POA: Diagnosis not present

## 2012-09-15 DIAGNOSIS — N138 Other obstructive and reflux uropathy: Secondary | ICD-10-CM | POA: Diagnosis present

## 2012-09-15 DIAGNOSIS — Z79899 Other long term (current) drug therapy: Secondary | ICD-10-CM | POA: Diagnosis not present

## 2012-09-15 DIAGNOSIS — Z7982 Long term (current) use of aspirin: Secondary | ICD-10-CM | POA: Diagnosis not present

## 2012-09-15 DIAGNOSIS — R4182 Altered mental status, unspecified: Secondary | ICD-10-CM | POA: Diagnosis not present

## 2012-09-15 DIAGNOSIS — K219 Gastro-esophageal reflux disease without esophagitis: Secondary | ICD-10-CM | POA: Diagnosis present

## 2012-09-15 DIAGNOSIS — R269 Unspecified abnormalities of gait and mobility: Secondary | ICD-10-CM | POA: Diagnosis not present

## 2012-09-15 DIAGNOSIS — R627 Adult failure to thrive: Secondary | ICD-10-CM | POA: Diagnosis not present

## 2012-09-15 DIAGNOSIS — I10 Essential (primary) hypertension: Secondary | ICD-10-CM | POA: Diagnosis not present

## 2012-09-15 DIAGNOSIS — N179 Acute kidney failure, unspecified: Secondary | ICD-10-CM | POA: Diagnosis not present

## 2012-09-15 DIAGNOSIS — R5381 Other malaise: Secondary | ICD-10-CM | POA: Diagnosis present

## 2012-09-15 DIAGNOSIS — R319 Hematuria, unspecified: Secondary | ICD-10-CM | POA: Diagnosis not present

## 2012-09-15 DIAGNOSIS — R55 Syncope and collapse: Secondary | ICD-10-CM | POA: Diagnosis not present

## 2012-09-15 DIAGNOSIS — N319 Neuromuscular dysfunction of bladder, unspecified: Secondary | ICD-10-CM | POA: Diagnosis not present

## 2012-09-15 DIAGNOSIS — F209 Schizophrenia, unspecified: Secondary | ICD-10-CM | POA: Diagnosis present

## 2012-09-21 DIAGNOSIS — I10 Essential (primary) hypertension: Secondary | ICD-10-CM | POA: Diagnosis not present

## 2012-09-21 DIAGNOSIS — R339 Retention of urine, unspecified: Secondary | ICD-10-CM | POA: Diagnosis not present

## 2012-09-21 DIAGNOSIS — N138 Other obstructive and reflux uropathy: Secondary | ICD-10-CM | POA: Diagnosis not present

## 2012-09-21 DIAGNOSIS — F209 Schizophrenia, unspecified: Secondary | ICD-10-CM | POA: Diagnosis not present

## 2012-09-21 DIAGNOSIS — N319 Neuromuscular dysfunction of bladder, unspecified: Secondary | ICD-10-CM | POA: Diagnosis not present

## 2012-09-22 DIAGNOSIS — N289 Disorder of kidney and ureter, unspecified: Secondary | ICD-10-CM | POA: Diagnosis not present

## 2012-09-22 DIAGNOSIS — D539 Nutritional anemia, unspecified: Secondary | ICD-10-CM | POA: Diagnosis not present

## 2012-09-22 DIAGNOSIS — N400 Enlarged prostate without lower urinary tract symptoms: Secondary | ICD-10-CM | POA: Diagnosis not present

## 2012-09-22 DIAGNOSIS — F209 Schizophrenia, unspecified: Secondary | ICD-10-CM | POA: Diagnosis not present

## 2012-09-22 DIAGNOSIS — J189 Pneumonia, unspecified organism: Secondary | ICD-10-CM | POA: Diagnosis not present

## 2012-09-22 DIAGNOSIS — N138 Other obstructive and reflux uropathy: Secondary | ICD-10-CM | POA: Diagnosis not present

## 2012-09-22 DIAGNOSIS — K219 Gastro-esophageal reflux disease without esophagitis: Secondary | ICD-10-CM | POA: Diagnosis present

## 2012-09-22 DIAGNOSIS — R5381 Other malaise: Secondary | ICD-10-CM | POA: Diagnosis not present

## 2012-09-22 DIAGNOSIS — D649 Anemia, unspecified: Secondary | ICD-10-CM | POA: Diagnosis present

## 2012-09-22 DIAGNOSIS — R509 Fever, unspecified: Secondary | ICD-10-CM | POA: Diagnosis not present

## 2012-09-22 DIAGNOSIS — R079 Chest pain, unspecified: Secondary | ICD-10-CM | POA: Diagnosis not present

## 2012-09-22 DIAGNOSIS — R269 Unspecified abnormalities of gait and mobility: Secondary | ICD-10-CM | POA: Diagnosis present

## 2012-09-22 DIAGNOSIS — R5383 Other fatigue: Secondary | ICD-10-CM | POA: Diagnosis not present

## 2012-09-22 DIAGNOSIS — E871 Hypo-osmolality and hyponatremia: Secondary | ICD-10-CM | POA: Diagnosis not present

## 2012-09-22 DIAGNOSIS — A499 Bacterial infection, unspecified: Secondary | ICD-10-CM | POA: Diagnosis not present

## 2012-09-22 DIAGNOSIS — N3090 Cystitis, unspecified without hematuria: Secondary | ICD-10-CM | POA: Diagnosis not present

## 2012-09-22 DIAGNOSIS — N319 Neuromuscular dysfunction of bladder, unspecified: Secondary | ICD-10-CM | POA: Diagnosis not present

## 2012-09-22 DIAGNOSIS — N390 Urinary tract infection, site not specified: Secondary | ICD-10-CM | POA: Diagnosis not present

## 2012-09-22 DIAGNOSIS — N179 Acute kidney failure, unspecified: Secondary | ICD-10-CM | POA: Diagnosis not present

## 2012-09-22 DIAGNOSIS — R339 Retention of urine, unspecified: Secondary | ICD-10-CM | POA: Diagnosis not present

## 2012-09-22 DIAGNOSIS — I10 Essential (primary) hypertension: Secondary | ICD-10-CM | POA: Diagnosis not present

## 2012-09-22 DIAGNOSIS — R404 Transient alteration of awareness: Secondary | ICD-10-CM | POA: Diagnosis not present

## 2012-09-22 DIAGNOSIS — N401 Enlarged prostate with lower urinary tract symptoms: Secondary | ICD-10-CM | POA: Diagnosis not present

## 2012-09-22 DIAGNOSIS — B49 Unspecified mycosis: Secondary | ICD-10-CM | POA: Diagnosis not present

## 2012-09-22 DIAGNOSIS — A419 Sepsis, unspecified organism: Secondary | ICD-10-CM | POA: Diagnosis not present

## 2012-09-22 DIAGNOSIS — R9431 Abnormal electrocardiogram [ECG] [EKG]: Secondary | ICD-10-CM | POA: Diagnosis not present

## 2012-09-22 DIAGNOSIS — Z7982 Long term (current) use of aspirin: Secondary | ICD-10-CM | POA: Diagnosis not present

## 2012-09-22 DIAGNOSIS — B9689 Other specified bacterial agents as the cause of diseases classified elsewhere: Secondary | ICD-10-CM | POA: Diagnosis not present

## 2012-09-22 DIAGNOSIS — Z79899 Other long term (current) drug therapy: Secondary | ICD-10-CM | POA: Diagnosis not present

## 2012-09-29 DIAGNOSIS — N319 Neuromuscular dysfunction of bladder, unspecified: Secondary | ICD-10-CM | POA: Diagnosis not present

## 2012-09-29 DIAGNOSIS — R339 Retention of urine, unspecified: Secondary | ICD-10-CM | POA: Diagnosis not present

## 2012-09-29 DIAGNOSIS — F209 Schizophrenia, unspecified: Secondary | ICD-10-CM | POA: Diagnosis not present

## 2012-09-29 DIAGNOSIS — I10 Essential (primary) hypertension: Secondary | ICD-10-CM | POA: Diagnosis not present

## 2012-09-29 DIAGNOSIS — N138 Other obstructive and reflux uropathy: Secondary | ICD-10-CM | POA: Diagnosis not present

## 2012-10-03 DIAGNOSIS — M6281 Muscle weakness (generalized): Secondary | ICD-10-CM | POA: Diagnosis present

## 2012-10-03 DIAGNOSIS — R262 Difficulty in walking, not elsewhere classified: Secondary | ICD-10-CM | POA: Diagnosis not present

## 2012-10-03 DIAGNOSIS — Z5189 Encounter for other specified aftercare: Secondary | ICD-10-CM | POA: Diagnosis not present

## 2012-10-03 DIAGNOSIS — R4182 Altered mental status, unspecified: Secondary | ICD-10-CM | POA: Diagnosis not present

## 2012-10-03 DIAGNOSIS — R269 Unspecified abnormalities of gait and mobility: Secondary | ICD-10-CM | POA: Diagnosis not present

## 2012-10-03 DIAGNOSIS — N390 Urinary tract infection, site not specified: Secondary | ICD-10-CM | POA: Diagnosis not present

## 2012-10-03 DIAGNOSIS — N3000 Acute cystitis without hematuria: Secondary | ICD-10-CM | POA: Diagnosis not present

## 2012-10-03 DIAGNOSIS — X58XXXA Exposure to other specified factors, initial encounter: Secondary | ICD-10-CM | POA: Diagnosis not present

## 2012-10-03 DIAGNOSIS — S2249XA Multiple fractures of ribs, unspecified side, initial encounter for closed fracture: Secondary | ICD-10-CM | POA: Diagnosis not present

## 2012-10-03 DIAGNOSIS — R627 Adult failure to thrive: Secondary | ICD-10-CM | POA: Diagnosis not present

## 2012-10-03 DIAGNOSIS — R5381 Other malaise: Secondary | ICD-10-CM | POA: Diagnosis not present

## 2012-10-03 DIAGNOSIS — D638 Anemia in other chronic diseases classified elsewhere: Secondary | ICD-10-CM | POA: Diagnosis not present

## 2012-10-03 DIAGNOSIS — F0150 Vascular dementia without behavioral disturbance: Secondary | ICD-10-CM | POA: Diagnosis not present

## 2012-10-03 DIAGNOSIS — Z792 Long term (current) use of antibiotics: Secondary | ICD-10-CM | POA: Diagnosis not present

## 2012-10-03 DIAGNOSIS — F209 Schizophrenia, unspecified: Secondary | ICD-10-CM | POA: Diagnosis not present

## 2012-10-03 DIAGNOSIS — E039 Hypothyroidism, unspecified: Secondary | ICD-10-CM | POA: Diagnosis present

## 2012-10-03 DIAGNOSIS — R079 Chest pain, unspecified: Secondary | ICD-10-CM | POA: Diagnosis present

## 2012-10-03 DIAGNOSIS — Z79899 Other long term (current) drug therapy: Secondary | ICD-10-CM | POA: Diagnosis not present

## 2012-10-03 DIAGNOSIS — F411 Generalized anxiety disorder: Secondary | ICD-10-CM | POA: Diagnosis present

## 2012-10-03 DIAGNOSIS — E871 Hypo-osmolality and hyponatremia: Secondary | ICD-10-CM | POA: Diagnosis not present

## 2012-10-03 DIAGNOSIS — R5383 Other fatigue: Secondary | ICD-10-CM | POA: Diagnosis not present

## 2012-10-03 DIAGNOSIS — J158 Pneumonia due to other specified bacteria: Secondary | ICD-10-CM | POA: Diagnosis not present

## 2012-10-03 DIAGNOSIS — F0390 Unspecified dementia without behavioral disturbance: Secondary | ICD-10-CM | POA: Diagnosis not present

## 2012-10-03 DIAGNOSIS — J189 Pneumonia, unspecified organism: Secondary | ICD-10-CM | POA: Diagnosis not present

## 2012-10-03 DIAGNOSIS — G9341 Metabolic encephalopathy: Secondary | ICD-10-CM | POA: Diagnosis not present

## 2012-10-03 DIAGNOSIS — R109 Unspecified abdominal pain: Secondary | ICD-10-CM | POA: Diagnosis not present

## 2012-10-03 DIAGNOSIS — Z7982 Long term (current) use of aspirin: Secondary | ICD-10-CM | POA: Diagnosis not present

## 2012-10-03 DIAGNOSIS — S3720XA Unspecified injury of bladder, initial encounter: Secondary | ICD-10-CM | POA: Diagnosis not present

## 2012-10-03 DIAGNOSIS — A419 Sepsis, unspecified organism: Secondary | ICD-10-CM | POA: Diagnosis not present

## 2012-10-06 DIAGNOSIS — N390 Urinary tract infection, site not specified: Secondary | ICD-10-CM | POA: Diagnosis not present

## 2012-10-06 DIAGNOSIS — R262 Difficulty in walking, not elsewhere classified: Secondary | ICD-10-CM | POA: Diagnosis not present

## 2012-10-06 DIAGNOSIS — Z5189 Encounter for other specified aftercare: Secondary | ICD-10-CM | POA: Diagnosis not present

## 2012-10-06 DIAGNOSIS — D638 Anemia in other chronic diseases classified elsewhere: Secondary | ICD-10-CM | POA: Diagnosis not present

## 2012-10-06 DIAGNOSIS — R269 Unspecified abnormalities of gait and mobility: Secondary | ICD-10-CM | POA: Diagnosis not present

## 2012-10-06 DIAGNOSIS — S3720XA Unspecified injury of bladder, initial encounter: Secondary | ICD-10-CM | POA: Diagnosis not present

## 2012-10-06 DIAGNOSIS — R4182 Altered mental status, unspecified: Secondary | ICD-10-CM | POA: Diagnosis not present

## 2012-10-06 DIAGNOSIS — G9341 Metabolic encephalopathy: Secondary | ICD-10-CM | POA: Diagnosis not present

## 2012-10-06 DIAGNOSIS — J189 Pneumonia, unspecified organism: Secondary | ICD-10-CM | POA: Diagnosis not present

## 2012-10-06 DIAGNOSIS — A419 Sepsis, unspecified organism: Secondary | ICD-10-CM | POA: Diagnosis not present

## 2012-10-06 DIAGNOSIS — R079 Chest pain, unspecified: Secondary | ICD-10-CM | POA: Diagnosis not present

## 2012-10-06 DIAGNOSIS — F209 Schizophrenia, unspecified: Secondary | ICD-10-CM | POA: Diagnosis not present

## 2012-10-06 DIAGNOSIS — N3000 Acute cystitis without hematuria: Secondary | ICD-10-CM | POA: Diagnosis not present

## 2012-10-06 DIAGNOSIS — M6281 Muscle weakness (generalized): Secondary | ICD-10-CM | POA: Diagnosis not present

## 2012-10-06 DIAGNOSIS — S2249XA Multiple fractures of ribs, unspecified side, initial encounter for closed fracture: Secondary | ICD-10-CM | POA: Diagnosis not present

## 2012-10-17 DIAGNOSIS — N138 Other obstructive and reflux uropathy: Secondary | ICD-10-CM | POA: Diagnosis not present

## 2012-10-17 DIAGNOSIS — F209 Schizophrenia, unspecified: Secondary | ICD-10-CM | POA: Diagnosis not present

## 2012-10-17 DIAGNOSIS — R339 Retention of urine, unspecified: Secondary | ICD-10-CM | POA: Diagnosis not present

## 2012-10-17 DIAGNOSIS — N319 Neuromuscular dysfunction of bladder, unspecified: Secondary | ICD-10-CM | POA: Diagnosis not present

## 2012-10-17 DIAGNOSIS — I10 Essential (primary) hypertension: Secondary | ICD-10-CM | POA: Diagnosis not present

## 2012-10-20 DIAGNOSIS — R339 Retention of urine, unspecified: Secondary | ICD-10-CM | POA: Diagnosis not present

## 2012-10-20 DIAGNOSIS — N319 Neuromuscular dysfunction of bladder, unspecified: Secondary | ICD-10-CM | POA: Diagnosis not present

## 2012-10-20 DIAGNOSIS — F209 Schizophrenia, unspecified: Secondary | ICD-10-CM | POA: Diagnosis not present

## 2012-10-20 DIAGNOSIS — I10 Essential (primary) hypertension: Secondary | ICD-10-CM | POA: Diagnosis not present

## 2012-10-20 DIAGNOSIS — N401 Enlarged prostate with lower urinary tract symptoms: Secondary | ICD-10-CM | POA: Diagnosis not present

## 2012-10-23 DIAGNOSIS — E039 Hypothyroidism, unspecified: Secondary | ICD-10-CM | POA: Diagnosis not present

## 2012-10-23 DIAGNOSIS — N390 Urinary tract infection, site not specified: Secondary | ICD-10-CM | POA: Diagnosis not present

## 2012-10-23 DIAGNOSIS — R569 Unspecified convulsions: Secondary | ICD-10-CM | POA: Diagnosis not present

## 2012-10-23 DIAGNOSIS — Z79899 Other long term (current) drug therapy: Secondary | ICD-10-CM | POA: Diagnosis not present

## 2012-10-23 DIAGNOSIS — F29 Unspecified psychosis not due to a substance or known physiological condition: Secondary | ICD-10-CM | POA: Diagnosis not present

## 2012-10-23 DIAGNOSIS — R4182 Altered mental status, unspecified: Secondary | ICD-10-CM | POA: Diagnosis not present

## 2012-10-23 DIAGNOSIS — F209 Schizophrenia, unspecified: Secondary | ICD-10-CM | POA: Diagnosis not present

## 2012-10-25 DIAGNOSIS — R339 Retention of urine, unspecified: Secondary | ICD-10-CM | POA: Diagnosis not present

## 2012-10-25 DIAGNOSIS — I10 Essential (primary) hypertension: Secondary | ICD-10-CM | POA: Diagnosis not present

## 2012-10-25 DIAGNOSIS — N319 Neuromuscular dysfunction of bladder, unspecified: Secondary | ICD-10-CM | POA: Diagnosis not present

## 2012-10-25 DIAGNOSIS — N401 Enlarged prostate with lower urinary tract symptoms: Secondary | ICD-10-CM | POA: Diagnosis not present

## 2012-10-25 DIAGNOSIS — F209 Schizophrenia, unspecified: Secondary | ICD-10-CM | POA: Diagnosis not present

## 2012-10-25 DIAGNOSIS — F200 Paranoid schizophrenia: Secondary | ICD-10-CM | POA: Diagnosis not present

## 2012-10-26 DIAGNOSIS — R5381 Other malaise: Secondary | ICD-10-CM | POA: Diagnosis not present

## 2012-10-26 DIAGNOSIS — G4700 Insomnia, unspecified: Secondary | ICD-10-CM | POA: Diagnosis not present

## 2012-10-26 DIAGNOSIS — E039 Hypothyroidism, unspecified: Secondary | ICD-10-CM | POA: Diagnosis not present

## 2012-10-26 DIAGNOSIS — I10 Essential (primary) hypertension: Secondary | ICD-10-CM | POA: Diagnosis not present

## 2012-10-28 DIAGNOSIS — F209 Schizophrenia, unspecified: Secondary | ICD-10-CM | POA: Diagnosis not present

## 2012-10-28 DIAGNOSIS — N319 Neuromuscular dysfunction of bladder, unspecified: Secondary | ICD-10-CM | POA: Diagnosis not present

## 2012-10-28 DIAGNOSIS — N401 Enlarged prostate with lower urinary tract symptoms: Secondary | ICD-10-CM | POA: Diagnosis not present

## 2012-10-28 DIAGNOSIS — I10 Essential (primary) hypertension: Secondary | ICD-10-CM | POA: Diagnosis not present

## 2012-10-28 DIAGNOSIS — R339 Retention of urine, unspecified: Secondary | ICD-10-CM | POA: Diagnosis not present

## 2012-10-30 DIAGNOSIS — R404 Transient alteration of awareness: Secondary | ICD-10-CM | POA: Diagnosis not present

## 2012-10-30 DIAGNOSIS — F919 Conduct disorder, unspecified: Secondary | ICD-10-CM | POA: Diagnosis not present

## 2012-10-30 DIAGNOSIS — G40909 Epilepsy, unspecified, not intractable, without status epilepticus: Secondary | ICD-10-CM | POA: Diagnosis not present

## 2012-10-30 DIAGNOSIS — N390 Urinary tract infection, site not specified: Secondary | ICD-10-CM | POA: Diagnosis not present

## 2012-10-30 DIAGNOSIS — A499 Bacterial infection, unspecified: Secondary | ICD-10-CM | POA: Diagnosis not present

## 2012-10-30 DIAGNOSIS — R4182 Altered mental status, unspecified: Secondary | ICD-10-CM | POA: Diagnosis not present

## 2012-10-30 DIAGNOSIS — F29 Unspecified psychosis not due to a substance or known physiological condition: Secondary | ICD-10-CM | POA: Diagnosis not present

## 2012-10-30 DIAGNOSIS — R55 Syncope and collapse: Secondary | ICD-10-CM | POA: Diagnosis not present

## 2012-10-31 DIAGNOSIS — N401 Enlarged prostate with lower urinary tract symptoms: Secondary | ICD-10-CM | POA: Diagnosis not present

## 2012-10-31 DIAGNOSIS — R339 Retention of urine, unspecified: Secondary | ICD-10-CM | POA: Diagnosis not present

## 2012-10-31 DIAGNOSIS — I10 Essential (primary) hypertension: Secondary | ICD-10-CM | POA: Diagnosis not present

## 2012-10-31 DIAGNOSIS — F209 Schizophrenia, unspecified: Secondary | ICD-10-CM | POA: Diagnosis not present

## 2012-10-31 DIAGNOSIS — N319 Neuromuscular dysfunction of bladder, unspecified: Secondary | ICD-10-CM | POA: Diagnosis not present

## 2012-11-02 DIAGNOSIS — N390 Urinary tract infection, site not specified: Secondary | ICD-10-CM | POA: Diagnosis not present

## 2012-11-02 DIAGNOSIS — R269 Unspecified abnormalities of gait and mobility: Secondary | ICD-10-CM | POA: Diagnosis not present

## 2012-11-02 DIAGNOSIS — N401 Enlarged prostate with lower urinary tract symptoms: Secondary | ICD-10-CM | POA: Diagnosis not present

## 2012-11-02 DIAGNOSIS — F209 Schizophrenia, unspecified: Secondary | ICD-10-CM | POA: Diagnosis not present

## 2012-11-02 DIAGNOSIS — I10 Essential (primary) hypertension: Secondary | ICD-10-CM | POA: Diagnosis not present

## 2012-11-03 DIAGNOSIS — R609 Edema, unspecified: Secondary | ICD-10-CM | POA: Diagnosis not present

## 2012-11-03 DIAGNOSIS — R5383 Other fatigue: Secondary | ICD-10-CM | POA: Diagnosis not present

## 2012-11-03 DIAGNOSIS — N390 Urinary tract infection, site not specified: Secondary | ICD-10-CM | POA: Diagnosis not present

## 2012-11-03 DIAGNOSIS — R5381 Other malaise: Secondary | ICD-10-CM | POA: Diagnosis not present

## 2012-11-04 DIAGNOSIS — F209 Schizophrenia, unspecified: Secondary | ICD-10-CM | POA: Diagnosis not present

## 2012-11-04 DIAGNOSIS — I10 Essential (primary) hypertension: Secondary | ICD-10-CM | POA: Diagnosis not present

## 2012-11-04 DIAGNOSIS — R269 Unspecified abnormalities of gait and mobility: Secondary | ICD-10-CM | POA: Diagnosis not present

## 2012-11-04 DIAGNOSIS — N390 Urinary tract infection, site not specified: Secondary | ICD-10-CM | POA: Diagnosis not present

## 2012-11-04 DIAGNOSIS — N138 Other obstructive and reflux uropathy: Secondary | ICD-10-CM | POA: Diagnosis not present

## 2012-11-06 DIAGNOSIS — B372 Candidiasis of skin and nail: Secondary | ICD-10-CM | POA: Diagnosis not present

## 2012-11-06 DIAGNOSIS — B379 Candidiasis, unspecified: Secondary | ICD-10-CM | POA: Diagnosis not present

## 2012-11-06 DIAGNOSIS — E039 Hypothyroidism, unspecified: Secondary | ICD-10-CM | POA: Diagnosis not present

## 2012-11-06 DIAGNOSIS — M542 Cervicalgia: Secondary | ICD-10-CM | POA: Diagnosis not present

## 2012-11-06 DIAGNOSIS — S139XXA Sprain of joints and ligaments of unspecified parts of neck, initial encounter: Secondary | ICD-10-CM | POA: Diagnosis not present

## 2012-11-06 DIAGNOSIS — R109 Unspecified abdominal pain: Secondary | ICD-10-CM | POA: Diagnosis not present

## 2012-11-07 DIAGNOSIS — I10 Essential (primary) hypertension: Secondary | ICD-10-CM | POA: Diagnosis not present

## 2012-11-07 DIAGNOSIS — N138 Other obstructive and reflux uropathy: Secondary | ICD-10-CM | POA: Diagnosis not present

## 2012-11-07 DIAGNOSIS — R269 Unspecified abnormalities of gait and mobility: Secondary | ICD-10-CM | POA: Diagnosis not present

## 2012-11-07 DIAGNOSIS — F209 Schizophrenia, unspecified: Secondary | ICD-10-CM | POA: Diagnosis not present

## 2012-11-07 DIAGNOSIS — N390 Urinary tract infection, site not specified: Secondary | ICD-10-CM | POA: Diagnosis not present

## 2012-11-08 DIAGNOSIS — I10 Essential (primary) hypertension: Secondary | ICD-10-CM | POA: Diagnosis not present

## 2012-11-08 DIAGNOSIS — N138 Other obstructive and reflux uropathy: Secondary | ICD-10-CM | POA: Diagnosis not present

## 2012-11-08 DIAGNOSIS — F209 Schizophrenia, unspecified: Secondary | ICD-10-CM | POA: Diagnosis not present

## 2012-11-08 DIAGNOSIS — R269 Unspecified abnormalities of gait and mobility: Secondary | ICD-10-CM | POA: Diagnosis not present

## 2012-11-08 DIAGNOSIS — N390 Urinary tract infection, site not specified: Secondary | ICD-10-CM | POA: Diagnosis not present

## 2012-11-09 DIAGNOSIS — R269 Unspecified abnormalities of gait and mobility: Secondary | ICD-10-CM | POA: Diagnosis not present

## 2012-11-09 DIAGNOSIS — N401 Enlarged prostate with lower urinary tract symptoms: Secondary | ICD-10-CM | POA: Diagnosis not present

## 2012-11-09 DIAGNOSIS — F209 Schizophrenia, unspecified: Secondary | ICD-10-CM | POA: Diagnosis not present

## 2012-11-09 DIAGNOSIS — I10 Essential (primary) hypertension: Secondary | ICD-10-CM | POA: Diagnosis not present

## 2012-11-09 DIAGNOSIS — N390 Urinary tract infection, site not specified: Secondary | ICD-10-CM | POA: Diagnosis not present

## 2012-11-10 DIAGNOSIS — I10 Essential (primary) hypertension: Secondary | ICD-10-CM | POA: Diagnosis not present

## 2012-11-10 DIAGNOSIS — N390 Urinary tract infection, site not specified: Secondary | ICD-10-CM | POA: Diagnosis not present

## 2012-11-10 DIAGNOSIS — F209 Schizophrenia, unspecified: Secondary | ICD-10-CM | POA: Diagnosis not present

## 2012-11-10 DIAGNOSIS — N138 Other obstructive and reflux uropathy: Secondary | ICD-10-CM | POA: Diagnosis not present

## 2012-11-10 DIAGNOSIS — R269 Unspecified abnormalities of gait and mobility: Secondary | ICD-10-CM | POA: Diagnosis not present

## 2012-11-14 DIAGNOSIS — N138 Other obstructive and reflux uropathy: Secondary | ICD-10-CM | POA: Diagnosis not present

## 2012-11-14 DIAGNOSIS — F209 Schizophrenia, unspecified: Secondary | ICD-10-CM | POA: Diagnosis not present

## 2012-11-14 DIAGNOSIS — I10 Essential (primary) hypertension: Secondary | ICD-10-CM | POA: Diagnosis not present

## 2012-11-14 DIAGNOSIS — R269 Unspecified abnormalities of gait and mobility: Secondary | ICD-10-CM | POA: Diagnosis not present

## 2012-11-14 DIAGNOSIS — N390 Urinary tract infection, site not specified: Secondary | ICD-10-CM | POA: Diagnosis not present

## 2012-11-15 DIAGNOSIS — I10 Essential (primary) hypertension: Secondary | ICD-10-CM | POA: Diagnosis not present

## 2012-11-15 DIAGNOSIS — N390 Urinary tract infection, site not specified: Secondary | ICD-10-CM | POA: Diagnosis not present

## 2012-11-15 DIAGNOSIS — S0100XA Unspecified open wound of scalp, initial encounter: Secondary | ICD-10-CM | POA: Diagnosis not present

## 2012-11-15 DIAGNOSIS — IMO0002 Reserved for concepts with insufficient information to code with codable children: Secondary | ICD-10-CM | POA: Diagnosis not present

## 2012-11-15 DIAGNOSIS — N138 Other obstructive and reflux uropathy: Secondary | ICD-10-CM | POA: Diagnosis not present

## 2012-11-15 DIAGNOSIS — R51 Headache: Secondary | ICD-10-CM | POA: Diagnosis not present

## 2012-11-15 DIAGNOSIS — W010XXA Fall on same level from slipping, tripping and stumbling without subsequent striking against object, initial encounter: Secondary | ICD-10-CM | POA: Diagnosis not present

## 2012-11-15 DIAGNOSIS — F209 Schizophrenia, unspecified: Secondary | ICD-10-CM | POA: Diagnosis not present

## 2012-11-15 DIAGNOSIS — S0990XA Unspecified injury of head, initial encounter: Secondary | ICD-10-CM | POA: Diagnosis not present

## 2012-11-15 DIAGNOSIS — R269 Unspecified abnormalities of gait and mobility: Secondary | ICD-10-CM | POA: Diagnosis not present

## 2012-11-17 DIAGNOSIS — N401 Enlarged prostate with lower urinary tract symptoms: Secondary | ICD-10-CM | POA: Diagnosis not present

## 2012-11-17 DIAGNOSIS — I10 Essential (primary) hypertension: Secondary | ICD-10-CM | POA: Diagnosis not present

## 2012-11-17 DIAGNOSIS — F209 Schizophrenia, unspecified: Secondary | ICD-10-CM | POA: Diagnosis not present

## 2012-11-17 DIAGNOSIS — N390 Urinary tract infection, site not specified: Secondary | ICD-10-CM | POA: Diagnosis not present

## 2012-11-17 DIAGNOSIS — R269 Unspecified abnormalities of gait and mobility: Secondary | ICD-10-CM | POA: Diagnosis not present

## 2012-11-18 DIAGNOSIS — I10 Essential (primary) hypertension: Secondary | ICD-10-CM | POA: Diagnosis not present

## 2012-11-18 DIAGNOSIS — N390 Urinary tract infection, site not specified: Secondary | ICD-10-CM | POA: Diagnosis not present

## 2012-11-18 DIAGNOSIS — R269 Unspecified abnormalities of gait and mobility: Secondary | ICD-10-CM | POA: Diagnosis not present

## 2012-11-18 DIAGNOSIS — F209 Schizophrenia, unspecified: Secondary | ICD-10-CM | POA: Diagnosis not present

## 2012-11-18 DIAGNOSIS — N401 Enlarged prostate with lower urinary tract symptoms: Secondary | ICD-10-CM | POA: Diagnosis not present

## 2012-11-21 DIAGNOSIS — G92 Toxic encephalopathy: Secondary | ICD-10-CM | POA: Diagnosis not present

## 2012-11-21 DIAGNOSIS — B49 Unspecified mycosis: Secondary | ICD-10-CM | POA: Diagnosis not present

## 2012-11-21 DIAGNOSIS — T426X1A Poisoning by other antiepileptic and sedative-hypnotic drugs, accidental (unintentional), initial encounter: Secondary | ICD-10-CM | POA: Diagnosis not present

## 2012-11-21 DIAGNOSIS — F2089 Other schizophrenia: Secondary | ICD-10-CM | POA: Diagnosis not present

## 2012-11-21 DIAGNOSIS — I2699 Other pulmonary embolism without acute cor pulmonale: Secondary | ICD-10-CM | POA: Diagnosis not present

## 2012-11-21 DIAGNOSIS — N390 Urinary tract infection, site not specified: Secondary | ICD-10-CM | POA: Diagnosis not present

## 2012-11-21 DIAGNOSIS — I82409 Acute embolism and thrombosis of unspecified deep veins of unspecified lower extremity: Secondary | ICD-10-CM | POA: Diagnosis not present

## 2012-11-21 DIAGNOSIS — R9431 Abnormal electrocardiogram [ECG] [EKG]: Secondary | ICD-10-CM | POA: Diagnosis not present

## 2012-11-21 DIAGNOSIS — R4182 Altered mental status, unspecified: Secondary | ICD-10-CM | POA: Diagnosis not present

## 2012-11-22 DIAGNOSIS — R338 Other retention of urine: Secondary | ICD-10-CM | POA: Diagnosis present

## 2012-11-22 DIAGNOSIS — R293 Abnormal posture: Secondary | ICD-10-CM | POA: Diagnosis not present

## 2012-11-22 DIAGNOSIS — R627 Adult failure to thrive: Secondary | ICD-10-CM | POA: Diagnosis present

## 2012-11-22 DIAGNOSIS — I10 Essential (primary) hypertension: Secondary | ICD-10-CM | POA: Diagnosis not present

## 2012-11-22 DIAGNOSIS — N400 Enlarged prostate without lower urinary tract symptoms: Secondary | ICD-10-CM | POA: Diagnosis not present

## 2012-11-22 DIAGNOSIS — I82409 Acute embolism and thrombosis of unspecified deep veins of unspecified lower extremity: Secondary | ICD-10-CM | POA: Diagnosis not present

## 2012-11-22 DIAGNOSIS — N319 Neuromuscular dysfunction of bladder, unspecified: Secondary | ICD-10-CM | POA: Diagnosis present

## 2012-11-22 DIAGNOSIS — Z7901 Long term (current) use of anticoagulants: Secondary | ICD-10-CM | POA: Diagnosis not present

## 2012-11-22 DIAGNOSIS — Z79899 Other long term (current) drug therapy: Secondary | ICD-10-CM | POA: Diagnosis not present

## 2012-11-22 DIAGNOSIS — I2699 Other pulmonary embolism without acute cor pulmonale: Secondary | ICD-10-CM | POA: Diagnosis not present

## 2012-11-22 DIAGNOSIS — E871 Hypo-osmolality and hyponatremia: Secondary | ICD-10-CM | POA: Diagnosis not present

## 2012-11-22 DIAGNOSIS — T426X1A Poisoning by other antiepileptic and sedative-hypnotic drugs, accidental (unintentional), initial encounter: Secondary | ICD-10-CM | POA: Diagnosis present

## 2012-11-22 DIAGNOSIS — F2089 Other schizophrenia: Secondary | ICD-10-CM | POA: Diagnosis not present

## 2012-11-22 DIAGNOSIS — N289 Disorder of kidney and ureter, unspecified: Secondary | ICD-10-CM | POA: Diagnosis not present

## 2012-11-22 DIAGNOSIS — E039 Hypothyroidism, unspecified: Secondary | ICD-10-CM | POA: Diagnosis present

## 2012-11-22 DIAGNOSIS — M6281 Muscle weakness (generalized): Secondary | ICD-10-CM | POA: Diagnosis not present

## 2012-11-22 DIAGNOSIS — N401 Enlarged prostate with lower urinary tract symptoms: Secondary | ICD-10-CM | POA: Diagnosis present

## 2012-11-22 DIAGNOSIS — G929 Unspecified toxic encephalopathy: Secondary | ICD-10-CM | POA: Diagnosis not present

## 2012-11-22 DIAGNOSIS — R892 Abnormal level of other drugs, medicaments and biological substances in specimens from other organs, systems and tissues: Secondary | ICD-10-CM | POA: Diagnosis not present

## 2012-11-22 DIAGNOSIS — R4181 Age-related cognitive decline: Secondary | ICD-10-CM | POA: Diagnosis present

## 2012-11-22 DIAGNOSIS — N390 Urinary tract infection, site not specified: Secondary | ICD-10-CM | POA: Diagnosis present

## 2012-11-22 DIAGNOSIS — R279 Unspecified lack of coordination: Secondary | ICD-10-CM | POA: Diagnosis not present

## 2012-11-22 DIAGNOSIS — I2789 Other specified pulmonary heart diseases: Secondary | ICD-10-CM | POA: Diagnosis present

## 2012-11-22 DIAGNOSIS — G92 Toxic encephalopathy: Secondary | ICD-10-CM | POA: Diagnosis not present

## 2012-11-22 DIAGNOSIS — G40909 Epilepsy, unspecified, not intractable, without status epilepticus: Secondary | ICD-10-CM | POA: Diagnosis present

## 2012-11-22 DIAGNOSIS — Z7982 Long term (current) use of aspirin: Secondary | ICD-10-CM | POA: Diagnosis not present

## 2012-11-22 DIAGNOSIS — R339 Retention of urine, unspecified: Secondary | ICD-10-CM | POA: Diagnosis not present

## 2012-11-22 DIAGNOSIS — E860 Dehydration: Secondary | ICD-10-CM | POA: Diagnosis present

## 2012-11-22 DIAGNOSIS — B49 Unspecified mycosis: Secondary | ICD-10-CM | POA: Diagnosis present

## 2012-11-22 DIAGNOSIS — Z9181 History of falling: Secondary | ICD-10-CM | POA: Diagnosis not present

## 2012-11-22 DIAGNOSIS — Z8546 Personal history of malignant neoplasm of prostate: Secondary | ICD-10-CM | POA: Diagnosis not present

## 2012-11-22 DIAGNOSIS — R269 Unspecified abnormalities of gait and mobility: Secondary | ICD-10-CM | POA: Diagnosis present

## 2012-11-22 DIAGNOSIS — I1310 Hypertensive heart and chronic kidney disease without heart failure, with stage 1 through stage 4 chronic kidney disease, or unspecified chronic kidney disease: Secondary | ICD-10-CM | POA: Diagnosis present

## 2012-11-22 DIAGNOSIS — Z87440 Personal history of urinary (tract) infections: Secondary | ICD-10-CM | POA: Diagnosis not present

## 2012-11-22 DIAGNOSIS — N189 Chronic kidney disease, unspecified: Secondary | ICD-10-CM | POA: Diagnosis present

## 2012-11-23 DIAGNOSIS — G92 Toxic encephalopathy: Secondary | ICD-10-CM | POA: Diagnosis not present

## 2012-11-23 DIAGNOSIS — R339 Retention of urine, unspecified: Secondary | ICD-10-CM | POA: Diagnosis not present

## 2012-11-23 DIAGNOSIS — I2699 Other pulmonary embolism without acute cor pulmonale: Secondary | ICD-10-CM | POA: Diagnosis not present

## 2012-11-23 DIAGNOSIS — F2089 Other schizophrenia: Secondary | ICD-10-CM | POA: Diagnosis not present

## 2012-11-24 DIAGNOSIS — I2699 Other pulmonary embolism without acute cor pulmonale: Secondary | ICD-10-CM | POA: Diagnosis not present

## 2012-11-24 DIAGNOSIS — F2089 Other schizophrenia: Secondary | ICD-10-CM | POA: Diagnosis not present

## 2012-11-24 DIAGNOSIS — R339 Retention of urine, unspecified: Secondary | ICD-10-CM | POA: Diagnosis not present

## 2012-11-24 DIAGNOSIS — G92 Toxic encephalopathy: Secondary | ICD-10-CM | POA: Diagnosis not present

## 2012-11-25 DIAGNOSIS — Z9181 History of falling: Secondary | ICD-10-CM | POA: Diagnosis not present

## 2012-11-25 DIAGNOSIS — G92 Toxic encephalopathy: Secondary | ICD-10-CM | POA: Diagnosis not present

## 2012-11-25 DIAGNOSIS — E871 Hypo-osmolality and hyponatremia: Secondary | ICD-10-CM | POA: Diagnosis not present

## 2012-11-25 DIAGNOSIS — F29 Unspecified psychosis not due to a substance or known physiological condition: Secondary | ICD-10-CM | POA: Diagnosis not present

## 2012-11-25 DIAGNOSIS — Z79899 Other long term (current) drug therapy: Secondary | ICD-10-CM | POA: Diagnosis not present

## 2012-11-25 DIAGNOSIS — Z87440 Personal history of urinary (tract) infections: Secondary | ICD-10-CM | POA: Diagnosis not present

## 2012-11-25 DIAGNOSIS — M6281 Muscle weakness (generalized): Secondary | ICD-10-CM | POA: Diagnosis not present

## 2012-11-25 DIAGNOSIS — I2699 Other pulmonary embolism without acute cor pulmonale: Secondary | ICD-10-CM | POA: Diagnosis not present

## 2012-11-25 DIAGNOSIS — S7000XA Contusion of unspecified hip, initial encounter: Secondary | ICD-10-CM | POA: Diagnosis not present

## 2012-11-25 DIAGNOSIS — R293 Abnormal posture: Secondary | ICD-10-CM | POA: Diagnosis not present

## 2012-11-25 DIAGNOSIS — R269 Unspecified abnormalities of gait and mobility: Secondary | ICD-10-CM | POA: Diagnosis not present

## 2012-11-25 DIAGNOSIS — K219 Gastro-esophageal reflux disease without esophagitis: Secondary | ICD-10-CM | POA: Diagnosis not present

## 2012-11-25 DIAGNOSIS — N400 Enlarged prostate without lower urinary tract symptoms: Secondary | ICD-10-CM | POA: Diagnosis not present

## 2012-11-25 DIAGNOSIS — Z5181 Encounter for therapeutic drug level monitoring: Secondary | ICD-10-CM | POA: Diagnosis not present

## 2012-11-25 DIAGNOSIS — Z5189 Encounter for other specified aftercare: Secondary | ICD-10-CM | POA: Diagnosis not present

## 2012-11-25 DIAGNOSIS — Z86010 Personal history of colonic polyps: Secondary | ICD-10-CM | POA: Diagnosis not present

## 2012-11-25 DIAGNOSIS — F2089 Other schizophrenia: Secondary | ICD-10-CM | POA: Diagnosis not present

## 2012-11-25 DIAGNOSIS — R279 Unspecified lack of coordination: Secondary | ICD-10-CM | POA: Diagnosis not present

## 2012-11-25 DIAGNOSIS — Z7901 Long term (current) use of anticoagulants: Secondary | ICD-10-CM | POA: Diagnosis not present

## 2012-11-25 DIAGNOSIS — K5730 Diverticulosis of large intestine without perforation or abscess without bleeding: Secondary | ICD-10-CM | POA: Diagnosis not present

## 2012-11-25 DIAGNOSIS — R339 Retention of urine, unspecified: Secondary | ICD-10-CM | POA: Diagnosis not present

## 2012-11-25 DIAGNOSIS — M7989 Other specified soft tissue disorders: Secondary | ICD-10-CM | POA: Diagnosis not present

## 2013-01-04 DIAGNOSIS — Z86010 Personal history of colonic polyps: Secondary | ICD-10-CM | POA: Diagnosis not present

## 2013-01-04 DIAGNOSIS — K5730 Diverticulosis of large intestine without perforation or abscess without bleeding: Secondary | ICD-10-CM | POA: Diagnosis not present

## 2013-01-04 DIAGNOSIS — K219 Gastro-esophageal reflux disease without esophagitis: Secondary | ICD-10-CM | POA: Diagnosis not present

## 2013-01-06 DIAGNOSIS — R269 Unspecified abnormalities of gait and mobility: Secondary | ICD-10-CM | POA: Diagnosis not present

## 2013-01-06 DIAGNOSIS — N319 Neuromuscular dysfunction of bladder, unspecified: Secondary | ICD-10-CM | POA: Diagnosis not present

## 2013-01-06 DIAGNOSIS — I10 Essential (primary) hypertension: Secondary | ICD-10-CM | POA: Diagnosis not present

## 2013-01-06 DIAGNOSIS — G40909 Epilepsy, unspecified, not intractable, without status epilepticus: Secondary | ICD-10-CM | POA: Diagnosis not present

## 2013-01-06 DIAGNOSIS — F209 Schizophrenia, unspecified: Secondary | ICD-10-CM | POA: Diagnosis not present

## 2013-01-09 DIAGNOSIS — N319 Neuromuscular dysfunction of bladder, unspecified: Secondary | ICD-10-CM | POA: Diagnosis not present

## 2013-01-09 DIAGNOSIS — F209 Schizophrenia, unspecified: Secondary | ICD-10-CM | POA: Diagnosis not present

## 2013-01-09 DIAGNOSIS — R269 Unspecified abnormalities of gait and mobility: Secondary | ICD-10-CM | POA: Diagnosis not present

## 2013-01-09 DIAGNOSIS — I10 Essential (primary) hypertension: Secondary | ICD-10-CM | POA: Diagnosis not present

## 2013-01-09 DIAGNOSIS — G40909 Epilepsy, unspecified, not intractable, without status epilepticus: Secondary | ICD-10-CM | POA: Diagnosis not present

## 2013-01-10 DIAGNOSIS — R269 Unspecified abnormalities of gait and mobility: Secondary | ICD-10-CM | POA: Diagnosis not present

## 2013-01-10 DIAGNOSIS — I10 Essential (primary) hypertension: Secondary | ICD-10-CM | POA: Diagnosis not present

## 2013-01-10 DIAGNOSIS — F209 Schizophrenia, unspecified: Secondary | ICD-10-CM | POA: Diagnosis not present

## 2013-01-10 DIAGNOSIS — N319 Neuromuscular dysfunction of bladder, unspecified: Secondary | ICD-10-CM | POA: Diagnosis not present

## 2013-01-10 DIAGNOSIS — G40909 Epilepsy, unspecified, not intractable, without status epilepticus: Secondary | ICD-10-CM | POA: Diagnosis not present

## 2013-01-11 DIAGNOSIS — I10 Essential (primary) hypertension: Secondary | ICD-10-CM | POA: Diagnosis not present

## 2013-01-11 DIAGNOSIS — E039 Hypothyroidism, unspecified: Secondary | ICD-10-CM | POA: Diagnosis not present

## 2013-01-12 DIAGNOSIS — G40909 Epilepsy, unspecified, not intractable, without status epilepticus: Secondary | ICD-10-CM | POA: Diagnosis not present

## 2013-01-12 DIAGNOSIS — F209 Schizophrenia, unspecified: Secondary | ICD-10-CM | POA: Diagnosis not present

## 2013-01-12 DIAGNOSIS — I10 Essential (primary) hypertension: Secondary | ICD-10-CM | POA: Diagnosis not present

## 2013-01-12 DIAGNOSIS — R269 Unspecified abnormalities of gait and mobility: Secondary | ICD-10-CM | POA: Diagnosis not present

## 2013-01-12 DIAGNOSIS — N319 Neuromuscular dysfunction of bladder, unspecified: Secondary | ICD-10-CM | POA: Diagnosis not present

## 2013-01-13 DIAGNOSIS — R269 Unspecified abnormalities of gait and mobility: Secondary | ICD-10-CM | POA: Diagnosis not present

## 2013-01-13 DIAGNOSIS — N319 Neuromuscular dysfunction of bladder, unspecified: Secondary | ICD-10-CM | POA: Diagnosis not present

## 2013-01-13 DIAGNOSIS — G40909 Epilepsy, unspecified, not intractable, without status epilepticus: Secondary | ICD-10-CM | POA: Diagnosis not present

## 2013-01-13 DIAGNOSIS — F209 Schizophrenia, unspecified: Secondary | ICD-10-CM | POA: Diagnosis not present

## 2013-01-13 DIAGNOSIS — I10 Essential (primary) hypertension: Secondary | ICD-10-CM | POA: Diagnosis not present

## 2013-01-16 DIAGNOSIS — N319 Neuromuscular dysfunction of bladder, unspecified: Secondary | ICD-10-CM | POA: Diagnosis not present

## 2013-01-16 DIAGNOSIS — I10 Essential (primary) hypertension: Secondary | ICD-10-CM | POA: Diagnosis not present

## 2013-01-16 DIAGNOSIS — R269 Unspecified abnormalities of gait and mobility: Secondary | ICD-10-CM | POA: Diagnosis not present

## 2013-01-16 DIAGNOSIS — F209 Schizophrenia, unspecified: Secondary | ICD-10-CM | POA: Diagnosis not present

## 2013-01-16 DIAGNOSIS — G40909 Epilepsy, unspecified, not intractable, without status epilepticus: Secondary | ICD-10-CM | POA: Diagnosis not present

## 2013-01-17 DIAGNOSIS — G40909 Epilepsy, unspecified, not intractable, without status epilepticus: Secondary | ICD-10-CM | POA: Diagnosis not present

## 2013-01-17 DIAGNOSIS — I10 Essential (primary) hypertension: Secondary | ICD-10-CM | POA: Diagnosis not present

## 2013-01-17 DIAGNOSIS — R269 Unspecified abnormalities of gait and mobility: Secondary | ICD-10-CM | POA: Diagnosis not present

## 2013-01-17 DIAGNOSIS — N319 Neuromuscular dysfunction of bladder, unspecified: Secondary | ICD-10-CM | POA: Diagnosis not present

## 2013-01-17 DIAGNOSIS — F209 Schizophrenia, unspecified: Secondary | ICD-10-CM | POA: Diagnosis not present

## 2013-01-18 DIAGNOSIS — I80299 Phlebitis and thrombophlebitis of other deep vessels of unspecified lower extremity: Secondary | ICD-10-CM | POA: Diagnosis not present

## 2013-01-18 DIAGNOSIS — E039 Hypothyroidism, unspecified: Secondary | ICD-10-CM | POA: Diagnosis not present

## 2013-01-19 DIAGNOSIS — F209 Schizophrenia, unspecified: Secondary | ICD-10-CM | POA: Diagnosis not present

## 2013-01-19 DIAGNOSIS — I10 Essential (primary) hypertension: Secondary | ICD-10-CM | POA: Diagnosis not present

## 2013-01-19 DIAGNOSIS — R269 Unspecified abnormalities of gait and mobility: Secondary | ICD-10-CM | POA: Diagnosis not present

## 2013-01-19 DIAGNOSIS — G40909 Epilepsy, unspecified, not intractable, without status epilepticus: Secondary | ICD-10-CM | POA: Diagnosis not present

## 2013-01-19 DIAGNOSIS — N319 Neuromuscular dysfunction of bladder, unspecified: Secondary | ICD-10-CM | POA: Diagnosis not present

## 2013-01-20 DIAGNOSIS — N319 Neuromuscular dysfunction of bladder, unspecified: Secondary | ICD-10-CM | POA: Diagnosis not present

## 2013-01-20 DIAGNOSIS — R269 Unspecified abnormalities of gait and mobility: Secondary | ICD-10-CM | POA: Diagnosis not present

## 2013-01-20 DIAGNOSIS — F209 Schizophrenia, unspecified: Secondary | ICD-10-CM | POA: Diagnosis not present

## 2013-01-20 DIAGNOSIS — G40909 Epilepsy, unspecified, not intractable, without status epilepticus: Secondary | ICD-10-CM | POA: Diagnosis not present

## 2013-01-20 DIAGNOSIS — I10 Essential (primary) hypertension: Secondary | ICD-10-CM | POA: Diagnosis not present

## 2013-01-23 DIAGNOSIS — N319 Neuromuscular dysfunction of bladder, unspecified: Secondary | ICD-10-CM | POA: Diagnosis not present

## 2013-01-23 DIAGNOSIS — F209 Schizophrenia, unspecified: Secondary | ICD-10-CM | POA: Diagnosis not present

## 2013-01-23 DIAGNOSIS — G40909 Epilepsy, unspecified, not intractable, without status epilepticus: Secondary | ICD-10-CM | POA: Diagnosis not present

## 2013-01-23 DIAGNOSIS — I10 Essential (primary) hypertension: Secondary | ICD-10-CM | POA: Diagnosis not present

## 2013-01-23 DIAGNOSIS — R269 Unspecified abnormalities of gait and mobility: Secondary | ICD-10-CM | POA: Diagnosis not present

## 2013-01-25 DIAGNOSIS — I10 Essential (primary) hypertension: Secondary | ICD-10-CM | POA: Diagnosis not present

## 2013-01-25 DIAGNOSIS — R269 Unspecified abnormalities of gait and mobility: Secondary | ICD-10-CM | POA: Diagnosis not present

## 2013-01-25 DIAGNOSIS — F209 Schizophrenia, unspecified: Secondary | ICD-10-CM | POA: Diagnosis not present

## 2013-01-25 DIAGNOSIS — G40909 Epilepsy, unspecified, not intractable, without status epilepticus: Secondary | ICD-10-CM | POA: Diagnosis not present

## 2013-01-25 DIAGNOSIS — N319 Neuromuscular dysfunction of bladder, unspecified: Secondary | ICD-10-CM | POA: Diagnosis not present

## 2013-01-26 DIAGNOSIS — G40909 Epilepsy, unspecified, not intractable, without status epilepticus: Secondary | ICD-10-CM | POA: Diagnosis not present

## 2013-01-26 DIAGNOSIS — N319 Neuromuscular dysfunction of bladder, unspecified: Secondary | ICD-10-CM | POA: Diagnosis not present

## 2013-01-26 DIAGNOSIS — R269 Unspecified abnormalities of gait and mobility: Secondary | ICD-10-CM | POA: Diagnosis not present

## 2013-01-26 DIAGNOSIS — I10 Essential (primary) hypertension: Secondary | ICD-10-CM | POA: Diagnosis not present

## 2013-01-26 DIAGNOSIS — F209 Schizophrenia, unspecified: Secondary | ICD-10-CM | POA: Diagnosis not present

## 2013-01-30 DIAGNOSIS — I10 Essential (primary) hypertension: Secondary | ICD-10-CM | POA: Diagnosis not present

## 2013-01-30 DIAGNOSIS — F209 Schizophrenia, unspecified: Secondary | ICD-10-CM | POA: Diagnosis not present

## 2013-01-30 DIAGNOSIS — G40909 Epilepsy, unspecified, not intractable, without status epilepticus: Secondary | ICD-10-CM | POA: Diagnosis not present

## 2013-01-30 DIAGNOSIS — R269 Unspecified abnormalities of gait and mobility: Secondary | ICD-10-CM | POA: Diagnosis not present

## 2013-01-30 DIAGNOSIS — N319 Neuromuscular dysfunction of bladder, unspecified: Secondary | ICD-10-CM | POA: Diagnosis not present

## 2013-02-01 DIAGNOSIS — N319 Neuromuscular dysfunction of bladder, unspecified: Secondary | ICD-10-CM | POA: Diagnosis not present

## 2013-02-01 DIAGNOSIS — F209 Schizophrenia, unspecified: Secondary | ICD-10-CM | POA: Diagnosis not present

## 2013-02-01 DIAGNOSIS — R269 Unspecified abnormalities of gait and mobility: Secondary | ICD-10-CM | POA: Diagnosis not present

## 2013-02-01 DIAGNOSIS — G40909 Epilepsy, unspecified, not intractable, without status epilepticus: Secondary | ICD-10-CM | POA: Diagnosis not present

## 2013-02-01 DIAGNOSIS — I10 Essential (primary) hypertension: Secondary | ICD-10-CM | POA: Diagnosis not present

## 2013-02-03 DIAGNOSIS — G40909 Epilepsy, unspecified, not intractable, without status epilepticus: Secondary | ICD-10-CM | POA: Diagnosis not present

## 2013-02-03 DIAGNOSIS — I10 Essential (primary) hypertension: Secondary | ICD-10-CM | POA: Diagnosis not present

## 2013-02-03 DIAGNOSIS — N319 Neuromuscular dysfunction of bladder, unspecified: Secondary | ICD-10-CM | POA: Diagnosis not present

## 2013-02-03 DIAGNOSIS — F209 Schizophrenia, unspecified: Secondary | ICD-10-CM | POA: Diagnosis not present

## 2013-02-03 DIAGNOSIS — R269 Unspecified abnormalities of gait and mobility: Secondary | ICD-10-CM | POA: Diagnosis not present

## 2013-02-10 DIAGNOSIS — G40909 Epilepsy, unspecified, not intractable, without status epilepticus: Secondary | ICD-10-CM | POA: Diagnosis not present

## 2013-02-10 DIAGNOSIS — I10 Essential (primary) hypertension: Secondary | ICD-10-CM | POA: Diagnosis not present

## 2013-02-10 DIAGNOSIS — F209 Schizophrenia, unspecified: Secondary | ICD-10-CM | POA: Diagnosis not present

## 2013-02-10 DIAGNOSIS — R269 Unspecified abnormalities of gait and mobility: Secondary | ICD-10-CM | POA: Diagnosis not present

## 2013-02-10 DIAGNOSIS — N319 Neuromuscular dysfunction of bladder, unspecified: Secondary | ICD-10-CM | POA: Diagnosis not present

## 2013-02-14 DIAGNOSIS — F200 Paranoid schizophrenia: Secondary | ICD-10-CM | POA: Diagnosis not present

## 2013-02-16 DIAGNOSIS — F209 Schizophrenia, unspecified: Secondary | ICD-10-CM | POA: Diagnosis not present

## 2013-02-16 DIAGNOSIS — R269 Unspecified abnormalities of gait and mobility: Secondary | ICD-10-CM | POA: Diagnosis not present

## 2013-02-16 DIAGNOSIS — N319 Neuromuscular dysfunction of bladder, unspecified: Secondary | ICD-10-CM | POA: Diagnosis not present

## 2013-02-16 DIAGNOSIS — G40909 Epilepsy, unspecified, not intractable, without status epilepticus: Secondary | ICD-10-CM | POA: Diagnosis not present

## 2013-02-16 DIAGNOSIS — I10 Essential (primary) hypertension: Secondary | ICD-10-CM | POA: Diagnosis not present

## 2013-02-20 DIAGNOSIS — E039 Hypothyroidism, unspecified: Secondary | ICD-10-CM | POA: Diagnosis not present

## 2013-02-20 DIAGNOSIS — I80299 Phlebitis and thrombophlebitis of other deep vessels of unspecified lower extremity: Secondary | ICD-10-CM | POA: Diagnosis not present

## 2013-02-23 DIAGNOSIS — I10 Essential (primary) hypertension: Secondary | ICD-10-CM | POA: Diagnosis not present

## 2013-02-23 DIAGNOSIS — G40909 Epilepsy, unspecified, not intractable, without status epilepticus: Secondary | ICD-10-CM | POA: Diagnosis not present

## 2013-02-23 DIAGNOSIS — R269 Unspecified abnormalities of gait and mobility: Secondary | ICD-10-CM | POA: Diagnosis not present

## 2013-02-23 DIAGNOSIS — N319 Neuromuscular dysfunction of bladder, unspecified: Secondary | ICD-10-CM | POA: Diagnosis not present

## 2013-02-23 DIAGNOSIS — F209 Schizophrenia, unspecified: Secondary | ICD-10-CM | POA: Diagnosis not present

## 2013-03-03 DIAGNOSIS — N319 Neuromuscular dysfunction of bladder, unspecified: Secondary | ICD-10-CM | POA: Diagnosis not present

## 2013-03-03 DIAGNOSIS — F209 Schizophrenia, unspecified: Secondary | ICD-10-CM | POA: Diagnosis not present

## 2013-03-03 DIAGNOSIS — R269 Unspecified abnormalities of gait and mobility: Secondary | ICD-10-CM | POA: Diagnosis not present

## 2013-03-03 DIAGNOSIS — G40909 Epilepsy, unspecified, not intractable, without status epilepticus: Secondary | ICD-10-CM | POA: Diagnosis not present

## 2013-03-03 DIAGNOSIS — I10 Essential (primary) hypertension: Secondary | ICD-10-CM | POA: Diagnosis not present

## 2013-03-07 DIAGNOSIS — N319 Neuromuscular dysfunction of bladder, unspecified: Secondary | ICD-10-CM | POA: Diagnosis not present

## 2013-03-07 DIAGNOSIS — F209 Schizophrenia, unspecified: Secondary | ICD-10-CM | POA: Diagnosis not present

## 2013-03-07 DIAGNOSIS — I10 Essential (primary) hypertension: Secondary | ICD-10-CM | POA: Diagnosis not present

## 2013-03-07 DIAGNOSIS — N400 Enlarged prostate without lower urinary tract symptoms: Secondary | ICD-10-CM | POA: Diagnosis not present

## 2013-03-07 DIAGNOSIS — G40909 Epilepsy, unspecified, not intractable, without status epilepticus: Secondary | ICD-10-CM | POA: Diagnosis not present

## 2013-03-07 DIAGNOSIS — I82409 Acute embolism and thrombosis of unspecified deep veins of unspecified lower extremity: Secondary | ICD-10-CM | POA: Diagnosis not present

## 2013-03-07 DIAGNOSIS — R569 Unspecified convulsions: Secondary | ICD-10-CM | POA: Diagnosis not present

## 2013-03-08 DIAGNOSIS — G40909 Epilepsy, unspecified, not intractable, without status epilepticus: Secondary | ICD-10-CM | POA: Diagnosis not present

## 2013-03-08 DIAGNOSIS — I10 Essential (primary) hypertension: Secondary | ICD-10-CM | POA: Diagnosis not present

## 2013-03-08 DIAGNOSIS — N319 Neuromuscular dysfunction of bladder, unspecified: Secondary | ICD-10-CM | POA: Diagnosis not present

## 2013-03-08 DIAGNOSIS — I82409 Acute embolism and thrombosis of unspecified deep veins of unspecified lower extremity: Secondary | ICD-10-CM | POA: Diagnosis not present

## 2013-03-08 DIAGNOSIS — F209 Schizophrenia, unspecified: Secondary | ICD-10-CM | POA: Diagnosis not present

## 2013-03-08 DIAGNOSIS — I2699 Other pulmonary embolism without acute cor pulmonale: Secondary | ICD-10-CM | POA: Diagnosis not present

## 2013-03-09 DIAGNOSIS — I10 Essential (primary) hypertension: Secondary | ICD-10-CM | POA: Diagnosis not present

## 2013-03-09 DIAGNOSIS — G40909 Epilepsy, unspecified, not intractable, without status epilepticus: Secondary | ICD-10-CM | POA: Diagnosis not present

## 2013-03-09 DIAGNOSIS — N319 Neuromuscular dysfunction of bladder, unspecified: Secondary | ICD-10-CM | POA: Diagnosis not present

## 2013-03-09 DIAGNOSIS — F209 Schizophrenia, unspecified: Secondary | ICD-10-CM | POA: Diagnosis not present

## 2013-03-09 DIAGNOSIS — I82409 Acute embolism and thrombosis of unspecified deep veins of unspecified lower extremity: Secondary | ICD-10-CM | POA: Diagnosis not present

## 2013-03-10 DIAGNOSIS — G40909 Epilepsy, unspecified, not intractable, without status epilepticus: Secondary | ICD-10-CM | POA: Diagnosis not present

## 2013-03-10 DIAGNOSIS — I10 Essential (primary) hypertension: Secondary | ICD-10-CM | POA: Diagnosis not present

## 2013-03-10 DIAGNOSIS — N319 Neuromuscular dysfunction of bladder, unspecified: Secondary | ICD-10-CM | POA: Diagnosis not present

## 2013-03-10 DIAGNOSIS — F209 Schizophrenia, unspecified: Secondary | ICD-10-CM | POA: Diagnosis not present

## 2013-03-10 DIAGNOSIS — I82409 Acute embolism and thrombosis of unspecified deep veins of unspecified lower extremity: Secondary | ICD-10-CM | POA: Diagnosis not present

## 2013-03-15 DIAGNOSIS — Z23 Encounter for immunization: Secondary | ICD-10-CM | POA: Diagnosis not present

## 2013-03-15 DIAGNOSIS — E039 Hypothyroidism, unspecified: Secondary | ICD-10-CM | POA: Diagnosis not present

## 2013-03-15 DIAGNOSIS — I10 Essential (primary) hypertension: Secondary | ICD-10-CM | POA: Diagnosis not present

## 2013-03-15 DIAGNOSIS — N319 Neuromuscular dysfunction of bladder, unspecified: Secondary | ICD-10-CM | POA: Diagnosis not present

## 2013-03-15 DIAGNOSIS — F209 Schizophrenia, unspecified: Secondary | ICD-10-CM | POA: Diagnosis not present

## 2013-03-15 DIAGNOSIS — I82409 Acute embolism and thrombosis of unspecified deep veins of unspecified lower extremity: Secondary | ICD-10-CM | POA: Diagnosis not present

## 2013-03-15 DIAGNOSIS — M79609 Pain in unspecified limb: Secondary | ICD-10-CM | POA: Diagnosis not present

## 2013-03-15 DIAGNOSIS — G40909 Epilepsy, unspecified, not intractable, without status epilepticus: Secondary | ICD-10-CM | POA: Diagnosis not present

## 2013-03-22 DIAGNOSIS — G40909 Epilepsy, unspecified, not intractable, without status epilepticus: Secondary | ICD-10-CM | POA: Diagnosis not present

## 2013-03-22 DIAGNOSIS — I10 Essential (primary) hypertension: Secondary | ICD-10-CM | POA: Diagnosis not present

## 2013-03-22 DIAGNOSIS — N319 Neuromuscular dysfunction of bladder, unspecified: Secondary | ICD-10-CM | POA: Diagnosis not present

## 2013-03-22 DIAGNOSIS — F209 Schizophrenia, unspecified: Secondary | ICD-10-CM | POA: Diagnosis not present

## 2013-03-22 DIAGNOSIS — I82409 Acute embolism and thrombosis of unspecified deep veins of unspecified lower extremity: Secondary | ICD-10-CM | POA: Diagnosis not present

## 2013-03-28 DIAGNOSIS — M25569 Pain in unspecified knee: Secondary | ICD-10-CM | POA: Diagnosis not present

## 2013-03-29 DIAGNOSIS — F209 Schizophrenia, unspecified: Secondary | ICD-10-CM | POA: Diagnosis not present

## 2013-03-29 DIAGNOSIS — N319 Neuromuscular dysfunction of bladder, unspecified: Secondary | ICD-10-CM | POA: Diagnosis not present

## 2013-03-29 DIAGNOSIS — G40909 Epilepsy, unspecified, not intractable, without status epilepticus: Secondary | ICD-10-CM | POA: Diagnosis not present

## 2013-03-29 DIAGNOSIS — I82409 Acute embolism and thrombosis of unspecified deep veins of unspecified lower extremity: Secondary | ICD-10-CM | POA: Diagnosis not present

## 2013-03-29 DIAGNOSIS — I10 Essential (primary) hypertension: Secondary | ICD-10-CM | POA: Diagnosis not present

## 2013-04-12 DIAGNOSIS — N319 Neuromuscular dysfunction of bladder, unspecified: Secondary | ICD-10-CM | POA: Diagnosis not present

## 2013-04-12 DIAGNOSIS — G40909 Epilepsy, unspecified, not intractable, without status epilepticus: Secondary | ICD-10-CM | POA: Diagnosis not present

## 2013-04-12 DIAGNOSIS — I82409 Acute embolism and thrombosis of unspecified deep veins of unspecified lower extremity: Secondary | ICD-10-CM | POA: Diagnosis not present

## 2013-04-12 DIAGNOSIS — I10 Essential (primary) hypertension: Secondary | ICD-10-CM | POA: Diagnosis not present

## 2013-04-12 DIAGNOSIS — F209 Schizophrenia, unspecified: Secondary | ICD-10-CM | POA: Diagnosis not present

## 2013-05-02 DIAGNOSIS — E039 Hypothyroidism, unspecified: Secondary | ICD-10-CM | POA: Diagnosis not present

## 2013-05-02 DIAGNOSIS — N400 Enlarged prostate without lower urinary tract symptoms: Secondary | ICD-10-CM | POA: Diagnosis not present

## 2013-05-02 DIAGNOSIS — I10 Essential (primary) hypertension: Secondary | ICD-10-CM | POA: Diagnosis not present

## 2013-05-03 DIAGNOSIS — N319 Neuromuscular dysfunction of bladder, unspecified: Secondary | ICD-10-CM | POA: Diagnosis not present

## 2013-05-03 DIAGNOSIS — I10 Essential (primary) hypertension: Secondary | ICD-10-CM | POA: Diagnosis not present

## 2013-05-03 DIAGNOSIS — G40909 Epilepsy, unspecified, not intractable, without status epilepticus: Secondary | ICD-10-CM | POA: Diagnosis not present

## 2013-05-03 DIAGNOSIS — I82409 Acute embolism and thrombosis of unspecified deep veins of unspecified lower extremity: Secondary | ICD-10-CM | POA: Diagnosis not present

## 2013-05-03 DIAGNOSIS — F209 Schizophrenia, unspecified: Secondary | ICD-10-CM | POA: Diagnosis not present

## 2013-05-16 DIAGNOSIS — F209 Schizophrenia, unspecified: Secondary | ICD-10-CM | POA: Diagnosis not present

## 2013-07-04 DIAGNOSIS — N400 Enlarged prostate without lower urinary tract symptoms: Secondary | ICD-10-CM | POA: Diagnosis not present

## 2013-07-04 DIAGNOSIS — E039 Hypothyroidism, unspecified: Secondary | ICD-10-CM | POA: Diagnosis not present

## 2013-07-04 DIAGNOSIS — J209 Acute bronchitis, unspecified: Secondary | ICD-10-CM | POA: Diagnosis not present

## 2013-07-04 DIAGNOSIS — I10 Essential (primary) hypertension: Secondary | ICD-10-CM | POA: Diagnosis not present

## 2013-08-14 DIAGNOSIS — F209 Schizophrenia, unspecified: Secondary | ICD-10-CM | POA: Diagnosis not present

## 2013-09-04 DIAGNOSIS — H3530 Unspecified macular degeneration: Secondary | ICD-10-CM | POA: Diagnosis not present

## 2013-09-04 DIAGNOSIS — K222 Esophageal obstruction: Secondary | ICD-10-CM | POA: Diagnosis not present

## 2013-09-04 DIAGNOSIS — K5730 Diverticulosis of large intestine without perforation or abscess without bleeding: Secondary | ICD-10-CM | POA: Diagnosis not present

## 2013-09-04 DIAGNOSIS — K5710 Diverticulosis of small intestine without perforation or abscess without bleeding: Secondary | ICD-10-CM | POA: Diagnosis not present

## 2013-09-04 DIAGNOSIS — Z86010 Personal history of colonic polyps: Secondary | ICD-10-CM | POA: Diagnosis not present

## 2013-09-05 DIAGNOSIS — R7309 Other abnormal glucose: Secondary | ICD-10-CM | POA: Diagnosis not present

## 2013-09-05 DIAGNOSIS — I10 Essential (primary) hypertension: Secondary | ICD-10-CM | POA: Diagnosis not present

## 2013-09-05 DIAGNOSIS — E039 Hypothyroidism, unspecified: Secondary | ICD-10-CM | POA: Diagnosis not present

## 2013-09-12 DIAGNOSIS — Z860100 Personal history of colon polyps, unspecified: Secondary | ICD-10-CM | POA: Diagnosis not present

## 2013-09-12 DIAGNOSIS — Z79899 Other long term (current) drug therapy: Secondary | ICD-10-CM | POA: Diagnosis not present

## 2013-09-12 DIAGNOSIS — K5900 Constipation, unspecified: Secondary | ICD-10-CM | POA: Diagnosis not present

## 2013-09-12 DIAGNOSIS — K644 Residual hemorrhoidal skin tags: Secondary | ICD-10-CM | POA: Diagnosis not present

## 2013-09-12 DIAGNOSIS — K5730 Diverticulosis of large intestine without perforation or abscess without bleeding: Secondary | ICD-10-CM | POA: Diagnosis not present

## 2013-09-12 DIAGNOSIS — Z1211 Encounter for screening for malignant neoplasm of colon: Secondary | ICD-10-CM | POA: Diagnosis not present

## 2013-11-06 DIAGNOSIS — I10 Essential (primary) hypertension: Secondary | ICD-10-CM | POA: Diagnosis not present

## 2013-11-06 DIAGNOSIS — E039 Hypothyroidism, unspecified: Secondary | ICD-10-CM | POA: Diagnosis not present

## 2013-11-21 DIAGNOSIS — I10 Essential (primary) hypertension: Secondary | ICD-10-CM | POA: Diagnosis not present

## 2013-12-11 DIAGNOSIS — F209 Schizophrenia, unspecified: Secondary | ICD-10-CM | POA: Diagnosis not present

## 2014-01-09 DIAGNOSIS — N400 Enlarged prostate without lower urinary tract symptoms: Secondary | ICD-10-CM | POA: Diagnosis not present

## 2014-01-09 DIAGNOSIS — E039 Hypothyroidism, unspecified: Secondary | ICD-10-CM | POA: Diagnosis not present

## 2014-01-09 DIAGNOSIS — I10 Essential (primary) hypertension: Secondary | ICD-10-CM | POA: Diagnosis not present

## 2014-03-12 DIAGNOSIS — H3530 Unspecified macular degeneration: Secondary | ICD-10-CM | POA: Diagnosis not present

## 2014-03-12 DIAGNOSIS — H04123 Dry eye syndrome of bilateral lacrimal glands: Secondary | ICD-10-CM | POA: Diagnosis not present

## 2014-03-13 DIAGNOSIS — I10 Essential (primary) hypertension: Secondary | ICD-10-CM | POA: Diagnosis not present

## 2014-03-13 DIAGNOSIS — Z23 Encounter for immunization: Secondary | ICD-10-CM | POA: Diagnosis not present

## 2014-03-13 DIAGNOSIS — E039 Hypothyroidism, unspecified: Secondary | ICD-10-CM | POA: Diagnosis not present

## 2014-04-02 DIAGNOSIS — F209 Schizophrenia, unspecified: Secondary | ICD-10-CM | POA: Diagnosis not present

## 2014-04-18 DIAGNOSIS — I10 Essential (primary) hypertension: Secondary | ICD-10-CM | POA: Diagnosis not present

## 2014-04-18 DIAGNOSIS — N390 Urinary tract infection, site not specified: Secondary | ICD-10-CM | POA: Diagnosis not present

## 2014-04-18 DIAGNOSIS — N400 Enlarged prostate without lower urinary tract symptoms: Secondary | ICD-10-CM | POA: Diagnosis not present

## 2015-04-09 DIAGNOSIS — N3001 Acute cystitis with hematuria: Secondary | ICD-10-CM | POA: Diagnosis not present

## 2015-04-09 DIAGNOSIS — S8251XA Displaced fracture of medial malleolus of right tibia, initial encounter for closed fracture: Secondary | ICD-10-CM | POA: Diagnosis not present

## 2015-04-09 DIAGNOSIS — S8261XA Displaced fracture of lateral malleolus of right fibula, initial encounter for closed fracture: Secondary | ICD-10-CM | POA: Diagnosis not present

## 2015-04-09 DIAGNOSIS — N390 Urinary tract infection, site not specified: Secondary | ICD-10-CM | POA: Diagnosis not present

## 2015-05-23 DIAGNOSIS — R0989 Other specified symptoms and signs involving the circulatory and respiratory systems: Secondary | ICD-10-CM | POA: Diagnosis not present

## 2015-05-23 DIAGNOSIS — R05 Cough: Secondary | ICD-10-CM | POA: Diagnosis not present

## 2015-05-26 DIAGNOSIS — N319 Neuromuscular dysfunction of bladder, unspecified: Secondary | ICD-10-CM | POA: Diagnosis not present

## 2015-05-26 DIAGNOSIS — G250 Essential tremor: Secondary | ICD-10-CM | POA: Diagnosis not present

## 2015-05-26 DIAGNOSIS — I10 Essential (primary) hypertension: Secondary | ICD-10-CM | POA: Diagnosis not present

## 2015-05-26 DIAGNOSIS — S82891A Other fracture of right lower leg, initial encounter for closed fracture: Secondary | ICD-10-CM | POA: Diagnosis not present

## 2015-05-26 DIAGNOSIS — M1990 Unspecified osteoarthritis, unspecified site: Secondary | ICD-10-CM | POA: Diagnosis not present

## 2015-05-26 DIAGNOSIS — R1310 Dysphagia, unspecified: Secondary | ICD-10-CM | POA: Diagnosis not present

## 2015-05-26 DIAGNOSIS — R262 Difficulty in walking, not elsewhere classified: Secondary | ICD-10-CM | POA: Diagnosis not present

## 2015-05-26 DIAGNOSIS — R569 Unspecified convulsions: Secondary | ICD-10-CM | POA: Diagnosis not present

## 2015-05-26 DIAGNOSIS — E039 Hypothyroidism, unspecified: Secondary | ICD-10-CM | POA: Diagnosis not present

## 2015-05-26 DIAGNOSIS — F200 Paranoid schizophrenia: Secondary | ICD-10-CM | POA: Diagnosis not present

## 2015-05-26 DIAGNOSIS — M6281 Muscle weakness (generalized): Secondary | ICD-10-CM | POA: Diagnosis not present

## 2015-06-12 DIAGNOSIS — S82841A Displaced bimalleolar fracture of right lower leg, initial encounter for closed fracture: Secondary | ICD-10-CM | POA: Diagnosis not present

## 2015-06-13 DIAGNOSIS — E039 Hypothyroidism, unspecified: Secondary | ICD-10-CM | POA: Diagnosis not present

## 2015-06-13 DIAGNOSIS — S82891D Other fracture of right lower leg, subsequent encounter for closed fracture with routine healing: Secondary | ICD-10-CM | POA: Diagnosis not present

## 2015-06-13 DIAGNOSIS — I10 Essential (primary) hypertension: Secondary | ICD-10-CM | POA: Diagnosis not present

## 2015-06-13 DIAGNOSIS — R569 Unspecified convulsions: Secondary | ICD-10-CM | POA: Diagnosis not present

## 2015-06-19 DIAGNOSIS — R262 Difficulty in walking, not elsewhere classified: Secondary | ICD-10-CM | POA: Diagnosis not present

## 2015-06-19 DIAGNOSIS — S82891A Other fracture of right lower leg, initial encounter for closed fracture: Secondary | ICD-10-CM | POA: Diagnosis not present

## 2015-06-19 DIAGNOSIS — M6281 Muscle weakness (generalized): Secondary | ICD-10-CM | POA: Diagnosis not present

## 2015-06-19 DIAGNOSIS — N319 Neuromuscular dysfunction of bladder, unspecified: Secondary | ICD-10-CM | POA: Diagnosis not present

## 2015-06-25 DIAGNOSIS — I10 Essential (primary) hypertension: Secondary | ICD-10-CM | POA: Diagnosis not present

## 2015-06-25 DIAGNOSIS — E559 Vitamin D deficiency, unspecified: Secondary | ICD-10-CM | POA: Diagnosis not present

## 2015-06-25 DIAGNOSIS — E039 Hypothyroidism, unspecified: Secondary | ICD-10-CM | POA: Diagnosis not present

## 2015-06-25 DIAGNOSIS — M6281 Muscle weakness (generalized): Secondary | ICD-10-CM | POA: Diagnosis not present

## 2015-06-25 DIAGNOSIS — E119 Type 2 diabetes mellitus without complications: Secondary | ICD-10-CM | POA: Diagnosis not present

## 2015-06-25 DIAGNOSIS — M1990 Unspecified osteoarthritis, unspecified site: Secondary | ICD-10-CM | POA: Diagnosis not present

## 2015-06-25 DIAGNOSIS — Z0000 Encounter for general adult medical examination without abnormal findings: Secondary | ICD-10-CM | POA: Diagnosis not present

## 2015-06-25 DIAGNOSIS — E569 Vitamin deficiency, unspecified: Secondary | ICD-10-CM | POA: Diagnosis not present

## 2015-06-27 DIAGNOSIS — S82891A Other fracture of right lower leg, initial encounter for closed fracture: Secondary | ICD-10-CM | POA: Diagnosis not present

## 2015-06-27 DIAGNOSIS — N319 Neuromuscular dysfunction of bladder, unspecified: Secondary | ICD-10-CM | POA: Diagnosis not present

## 2015-06-27 DIAGNOSIS — M6281 Muscle weakness (generalized): Secondary | ICD-10-CM | POA: Diagnosis not present

## 2015-07-04 DIAGNOSIS — R569 Unspecified convulsions: Secondary | ICD-10-CM | POA: Diagnosis not present

## 2015-07-04 DIAGNOSIS — I10 Essential (primary) hypertension: Secondary | ICD-10-CM | POA: Diagnosis not present

## 2015-07-04 DIAGNOSIS — E039 Hypothyroidism, unspecified: Secondary | ICD-10-CM | POA: Diagnosis not present

## 2015-07-04 DIAGNOSIS — S82891D Other fracture of right lower leg, subsequent encounter for closed fracture with routine healing: Secondary | ICD-10-CM | POA: Diagnosis not present

## 2015-07-11 DIAGNOSIS — M6281 Muscle weakness (generalized): Secondary | ICD-10-CM | POA: Diagnosis not present

## 2015-07-30 DIAGNOSIS — S82841A Displaced bimalleolar fracture of right lower leg, initial encounter for closed fracture: Secondary | ICD-10-CM | POA: Diagnosis not present

## 2015-08-01 DIAGNOSIS — F209 Schizophrenia, unspecified: Secondary | ICD-10-CM | POA: Diagnosis not present

## 2015-08-01 DIAGNOSIS — Z79899 Other long term (current) drug therapy: Secondary | ICD-10-CM | POA: Diagnosis not present

## 2015-08-05 DIAGNOSIS — S82891D Other fracture of right lower leg, subsequent encounter for closed fracture with routine healing: Secondary | ICD-10-CM | POA: Diagnosis not present

## 2015-08-05 DIAGNOSIS — N312 Flaccid neuropathic bladder, not elsewhere classified: Secondary | ICD-10-CM | POA: Diagnosis not present

## 2015-08-05 DIAGNOSIS — F200 Paranoid schizophrenia: Secondary | ICD-10-CM | POA: Diagnosis not present

## 2015-08-05 DIAGNOSIS — I10 Essential (primary) hypertension: Secondary | ICD-10-CM | POA: Diagnosis not present

## 2015-08-20 DIAGNOSIS — F200 Paranoid schizophrenia: Secondary | ICD-10-CM | POA: Diagnosis not present

## 2015-08-20 DIAGNOSIS — G2589 Other specified extrapyramidal and movement disorders: Secondary | ICD-10-CM | POA: Diagnosis not present

## 2015-08-20 DIAGNOSIS — F411 Generalized anxiety disorder: Secondary | ICD-10-CM | POA: Diagnosis not present

## 2015-08-20 DIAGNOSIS — G4700 Insomnia, unspecified: Secondary | ICD-10-CM | POA: Diagnosis not present

## 2015-08-21 DIAGNOSIS — I10 Essential (primary) hypertension: Secondary | ICD-10-CM | POA: Diagnosis not present

## 2015-08-21 DIAGNOSIS — G40909 Epilepsy, unspecified, not intractable, without status epilepticus: Secondary | ICD-10-CM | POA: Diagnosis not present

## 2015-08-21 DIAGNOSIS — L2089 Other atopic dermatitis: Secondary | ICD-10-CM | POA: Diagnosis not present

## 2015-08-21 DIAGNOSIS — S91001A Unspecified open wound, right ankle, initial encounter: Secondary | ICD-10-CM | POA: Diagnosis not present

## 2015-08-27 DIAGNOSIS — S82891A Other fracture of right lower leg, initial encounter for closed fracture: Secondary | ICD-10-CM | POA: Diagnosis not present

## 2015-08-27 DIAGNOSIS — R569 Unspecified convulsions: Secondary | ICD-10-CM | POA: Diagnosis not present

## 2015-08-27 DIAGNOSIS — I10 Essential (primary) hypertension: Secondary | ICD-10-CM | POA: Diagnosis not present

## 2015-08-27 DIAGNOSIS — R339 Retention of urine, unspecified: Secondary | ICD-10-CM | POA: Diagnosis not present

## 2015-08-27 DIAGNOSIS — F200 Paranoid schizophrenia: Secondary | ICD-10-CM | POA: Diagnosis not present

## 2015-08-27 DIAGNOSIS — M6281 Muscle weakness (generalized): Secondary | ICD-10-CM | POA: Diagnosis not present

## 2015-08-27 DIAGNOSIS — M1990 Unspecified osteoarthritis, unspecified site: Secondary | ICD-10-CM | POA: Diagnosis not present

## 2015-08-27 DIAGNOSIS — R251 Tremor, unspecified: Secondary | ICD-10-CM | POA: Diagnosis not present

## 2015-08-27 DIAGNOSIS — R262 Difficulty in walking, not elsewhere classified: Secondary | ICD-10-CM | POA: Diagnosis not present

## 2015-08-27 DIAGNOSIS — R1310 Dysphagia, unspecified: Secondary | ICD-10-CM | POA: Diagnosis not present

## 2015-08-27 DIAGNOSIS — Z7982 Long term (current) use of aspirin: Secondary | ICD-10-CM | POA: Diagnosis not present

## 2015-08-27 DIAGNOSIS — W19XXXA Unspecified fall, initial encounter: Secondary | ICD-10-CM | POA: Diagnosis not present

## 2015-08-27 DIAGNOSIS — N319 Neuromuscular dysfunction of bladder, unspecified: Secondary | ICD-10-CM | POA: Diagnosis not present

## 2015-08-27 DIAGNOSIS — E039 Hypothyroidism, unspecified: Secondary | ICD-10-CM | POA: Diagnosis not present

## 2015-09-05 DIAGNOSIS — R569 Unspecified convulsions: Secondary | ICD-10-CM | POA: Diagnosis not present

## 2015-09-05 DIAGNOSIS — F200 Paranoid schizophrenia: Secondary | ICD-10-CM | POA: Diagnosis not present

## 2015-09-05 DIAGNOSIS — S82891A Other fracture of right lower leg, initial encounter for closed fracture: Secondary | ICD-10-CM | POA: Diagnosis not present

## 2015-09-25 DIAGNOSIS — J069 Acute upper respiratory infection, unspecified: Secondary | ICD-10-CM | POA: Diagnosis not present

## 2015-09-25 DIAGNOSIS — I10 Essential (primary) hypertension: Secondary | ICD-10-CM | POA: Diagnosis not present

## 2015-10-01 DIAGNOSIS — N390 Urinary tract infection, site not specified: Secondary | ICD-10-CM | POA: Diagnosis not present

## 2015-10-16 DIAGNOSIS — J069 Acute upper respiratory infection, unspecified: Secondary | ICD-10-CM | POA: Diagnosis not present

## 2015-10-16 DIAGNOSIS — L84 Corns and callosities: Secondary | ICD-10-CM | POA: Diagnosis not present

## 2015-10-16 DIAGNOSIS — I10 Essential (primary) hypertension: Secondary | ICD-10-CM | POA: Diagnosis not present

## 2015-10-25 DIAGNOSIS — M79672 Pain in left foot: Secondary | ICD-10-CM | POA: Diagnosis not present

## 2015-10-25 DIAGNOSIS — M79671 Pain in right foot: Secondary | ICD-10-CM | POA: Diagnosis not present

## 2015-10-25 DIAGNOSIS — B351 Tinea unguium: Secondary | ICD-10-CM | POA: Diagnosis not present

## 2015-11-05 DIAGNOSIS — G4700 Insomnia, unspecified: Secondary | ICD-10-CM | POA: Diagnosis not present

## 2015-11-05 DIAGNOSIS — F411 Generalized anxiety disorder: Secondary | ICD-10-CM | POA: Diagnosis not present

## 2015-11-05 DIAGNOSIS — G2589 Other specified extrapyramidal and movement disorders: Secondary | ICD-10-CM | POA: Diagnosis not present

## 2015-11-05 DIAGNOSIS — F200 Paranoid schizophrenia: Secondary | ICD-10-CM | POA: Diagnosis not present

## 2015-12-18 DIAGNOSIS — M545 Low back pain: Secondary | ICD-10-CM | POA: Diagnosis not present

## 2015-12-18 DIAGNOSIS — I10 Essential (primary) hypertension: Secondary | ICD-10-CM | POA: Diagnosis not present

## 2015-12-18 DIAGNOSIS — N400 Enlarged prostate without lower urinary tract symptoms: Secondary | ICD-10-CM | POA: Diagnosis not present

## 2015-12-18 DIAGNOSIS — R569 Unspecified convulsions: Secondary | ICD-10-CM | POA: Diagnosis not present

## 2015-12-19 DIAGNOSIS — E559 Vitamin D deficiency, unspecified: Secondary | ICD-10-CM | POA: Diagnosis not present

## 2015-12-19 DIAGNOSIS — Z79899 Other long term (current) drug therapy: Secondary | ICD-10-CM | POA: Diagnosis not present

## 2015-12-19 DIAGNOSIS — E119 Type 2 diabetes mellitus without complications: Secondary | ICD-10-CM | POA: Diagnosis not present

## 2015-12-19 DIAGNOSIS — D518 Other vitamin B12 deficiency anemias: Secondary | ICD-10-CM | POA: Diagnosis not present

## 2015-12-19 DIAGNOSIS — E782 Mixed hyperlipidemia: Secondary | ICD-10-CM | POA: Diagnosis not present

## 2015-12-19 DIAGNOSIS — E038 Other specified hypothyroidism: Secondary | ICD-10-CM | POA: Diagnosis not present

## 2015-12-20 DIAGNOSIS — D492 Neoplasm of unspecified behavior of bone, soft tissue, and skin: Secondary | ICD-10-CM | POA: Diagnosis not present

## 2015-12-20 DIAGNOSIS — N390 Urinary tract infection, site not specified: Secondary | ICD-10-CM | POA: Diagnosis not present

## 2015-12-20 DIAGNOSIS — R221 Localized swelling, mass and lump, neck: Secondary | ICD-10-CM | POA: Diagnosis not present

## 2015-12-20 DIAGNOSIS — R0989 Other specified symptoms and signs involving the circulatory and respiratory systems: Secondary | ICD-10-CM | POA: Diagnosis not present

## 2015-12-25 DIAGNOSIS — R52 Pain, unspecified: Secondary | ICD-10-CM | POA: Diagnosis not present

## 2015-12-25 DIAGNOSIS — K0889 Other specified disorders of teeth and supporting structures: Secondary | ICD-10-CM | POA: Diagnosis not present

## 2016-01-06 DIAGNOSIS — N390 Urinary tract infection, site not specified: Secondary | ICD-10-CM | POA: Diagnosis not present

## 2016-01-08 DIAGNOSIS — N400 Enlarged prostate without lower urinary tract symptoms: Secondary | ICD-10-CM | POA: Diagnosis not present

## 2016-01-08 DIAGNOSIS — L2089 Other atopic dermatitis: Secondary | ICD-10-CM | POA: Diagnosis not present

## 2016-01-08 DIAGNOSIS — R319 Hematuria, unspecified: Secondary | ICD-10-CM | POA: Diagnosis not present

## 2016-01-17 DIAGNOSIS — G250 Essential tremor: Secondary | ICD-10-CM | POA: Diagnosis not present

## 2016-01-17 DIAGNOSIS — R569 Unspecified convulsions: Secondary | ICD-10-CM | POA: Diagnosis not present

## 2016-01-17 DIAGNOSIS — I10 Essential (primary) hypertension: Secondary | ICD-10-CM | POA: Diagnosis not present

## 2016-01-17 DIAGNOSIS — L209 Atopic dermatitis, unspecified: Secondary | ICD-10-CM | POA: Diagnosis not present

## 2016-01-21 DIAGNOSIS — G4700 Insomnia, unspecified: Secondary | ICD-10-CM | POA: Diagnosis not present

## 2016-01-21 DIAGNOSIS — G2589 Other specified extrapyramidal and movement disorders: Secondary | ICD-10-CM | POA: Diagnosis not present

## 2016-01-21 DIAGNOSIS — F411 Generalized anxiety disorder: Secondary | ICD-10-CM | POA: Diagnosis not present

## 2016-01-21 DIAGNOSIS — F200 Paranoid schizophrenia: Secondary | ICD-10-CM | POA: Diagnosis not present

## 2016-01-26 DIAGNOSIS — K0889 Other specified disorders of teeth and supporting structures: Secondary | ICD-10-CM | POA: Diagnosis not present

## 2016-02-04 DIAGNOSIS — G2589 Other specified extrapyramidal and movement disorders: Secondary | ICD-10-CM | POA: Diagnosis not present

## 2016-02-04 DIAGNOSIS — G4700 Insomnia, unspecified: Secondary | ICD-10-CM | POA: Diagnosis not present

## 2016-02-04 DIAGNOSIS — F411 Generalized anxiety disorder: Secondary | ICD-10-CM | POA: Diagnosis not present

## 2016-02-04 DIAGNOSIS — F200 Paranoid schizophrenia: Secondary | ICD-10-CM | POA: Diagnosis not present

## 2016-02-12 DIAGNOSIS — N400 Enlarged prostate without lower urinary tract symptoms: Secondary | ICD-10-CM | POA: Diagnosis not present

## 2016-02-12 DIAGNOSIS — N390 Urinary tract infection, site not specified: Secondary | ICD-10-CM | POA: Diagnosis not present

## 2016-02-12 DIAGNOSIS — F200 Paranoid schizophrenia: Secondary | ICD-10-CM | POA: Diagnosis not present

## 2016-02-13 DIAGNOSIS — D518 Other vitamin B12 deficiency anemias: Secondary | ICD-10-CM | POA: Diagnosis not present

## 2016-02-13 DIAGNOSIS — Z79899 Other long term (current) drug therapy: Secondary | ICD-10-CM | POA: Diagnosis not present

## 2016-02-13 DIAGNOSIS — E119 Type 2 diabetes mellitus without complications: Secondary | ICD-10-CM | POA: Diagnosis not present

## 2016-02-13 DIAGNOSIS — E782 Mixed hyperlipidemia: Secondary | ICD-10-CM | POA: Diagnosis not present

## 2016-02-14 DIAGNOSIS — N390 Urinary tract infection, site not specified: Secondary | ICD-10-CM | POA: Diagnosis not present

## 2016-02-19 DIAGNOSIS — N390 Urinary tract infection, site not specified: Secondary | ICD-10-CM | POA: Diagnosis not present

## 2016-02-20 DIAGNOSIS — R339 Retention of urine, unspecified: Secondary | ICD-10-CM | POA: Diagnosis not present

## 2016-02-20 DIAGNOSIS — N390 Urinary tract infection, site not specified: Secondary | ICD-10-CM | POA: Diagnosis not present

## 2016-02-24 DIAGNOSIS — N318 Other neuromuscular dysfunction of bladder: Secondary | ICD-10-CM | POA: Diagnosis not present

## 2016-02-24 DIAGNOSIS — R338 Other retention of urine: Secondary | ICD-10-CM | POA: Diagnosis not present

## 2016-02-24 DIAGNOSIS — N358 Other urethral stricture: Secondary | ICD-10-CM | POA: Diagnosis not present

## 2016-02-24 DIAGNOSIS — N3000 Acute cystitis without hematuria: Secondary | ICD-10-CM | POA: Diagnosis not present

## 2016-02-25 DIAGNOSIS — N401 Enlarged prostate with lower urinary tract symptoms: Secondary | ICD-10-CM | POA: Diagnosis not present

## 2016-02-25 DIAGNOSIS — N3090 Cystitis, unspecified without hematuria: Secondary | ICD-10-CM | POA: Diagnosis not present

## 2016-03-04 DIAGNOSIS — G9340 Encephalopathy, unspecified: Secondary | ICD-10-CM | POA: Diagnosis not present

## 2016-03-04 DIAGNOSIS — R310 Gross hematuria: Secondary | ICD-10-CM | POA: Diagnosis not present

## 2016-03-04 DIAGNOSIS — N1330 Unspecified hydronephrosis: Secondary | ICD-10-CM | POA: Diagnosis not present

## 2016-03-05 DIAGNOSIS — N312 Flaccid neuropathic bladder, not elsewhere classified: Secondary | ICD-10-CM | POA: Diagnosis not present

## 2016-03-05 DIAGNOSIS — N318 Other neuromuscular dysfunction of bladder: Secondary | ICD-10-CM | POA: Diagnosis not present

## 2016-03-05 DIAGNOSIS — N3020 Other chronic cystitis without hematuria: Secondary | ICD-10-CM | POA: Diagnosis not present

## 2016-03-05 DIAGNOSIS — N358 Other urethral stricture: Secondary | ICD-10-CM | POA: Diagnosis not present

## 2016-03-11 DIAGNOSIS — L309 Dermatitis, unspecified: Secondary | ICD-10-CM | POA: Diagnosis not present

## 2016-03-11 DIAGNOSIS — R339 Retention of urine, unspecified: Secondary | ICD-10-CM | POA: Diagnosis not present

## 2016-03-11 DIAGNOSIS — N390 Urinary tract infection, site not specified: Secondary | ICD-10-CM | POA: Diagnosis not present

## 2016-03-18 DIAGNOSIS — N390 Urinary tract infection, site not specified: Secondary | ICD-10-CM | POA: Diagnosis not present

## 2016-03-18 DIAGNOSIS — R569 Unspecified convulsions: Secondary | ICD-10-CM | POA: Diagnosis not present

## 2016-03-18 DIAGNOSIS — M545 Low back pain: Secondary | ICD-10-CM | POA: Diagnosis not present

## 2016-03-18 DIAGNOSIS — N400 Enlarged prostate without lower urinary tract symptoms: Secondary | ICD-10-CM | POA: Diagnosis not present

## 2016-03-20 DIAGNOSIS — B351 Tinea unguium: Secondary | ICD-10-CM | POA: Diagnosis not present

## 2016-03-20 DIAGNOSIS — L84 Corns and callosities: Secondary | ICD-10-CM | POA: Diagnosis not present

## 2016-03-20 DIAGNOSIS — I70203 Unspecified atherosclerosis of native arteries of extremities, bilateral legs: Secondary | ICD-10-CM | POA: Diagnosis not present

## 2016-03-20 DIAGNOSIS — M79674 Pain in right toe(s): Secondary | ICD-10-CM | POA: Diagnosis not present

## 2016-03-27 DIAGNOSIS — N390 Urinary tract infection, site not specified: Secondary | ICD-10-CM | POA: Diagnosis not present

## 2016-03-30 DIAGNOSIS — N3000 Acute cystitis without hematuria: Secondary | ICD-10-CM | POA: Diagnosis not present

## 2016-03-30 DIAGNOSIS — R259 Unspecified abnormal involuntary movements: Secondary | ICD-10-CM | POA: Diagnosis not present

## 2016-03-30 DIAGNOSIS — M25529 Pain in unspecified elbow: Secondary | ICD-10-CM | POA: Diagnosis not present

## 2016-03-30 DIAGNOSIS — M7989 Other specified soft tissue disorders: Secondary | ICD-10-CM | POA: Diagnosis not present

## 2016-03-30 DIAGNOSIS — R531 Weakness: Secondary | ICD-10-CM | POA: Diagnosis not present

## 2016-03-30 DIAGNOSIS — S43005A Unspecified dislocation of left shoulder joint, initial encounter: Secondary | ICD-10-CM | POA: Diagnosis not present

## 2016-04-01 DIAGNOSIS — R935 Abnormal findings on diagnostic imaging of other abdominal regions, including retroperitoneum: Secondary | ICD-10-CM | POA: Diagnosis not present

## 2016-04-01 DIAGNOSIS — N390 Urinary tract infection, site not specified: Secondary | ICD-10-CM | POA: Diagnosis not present

## 2016-04-01 DIAGNOSIS — N3001 Acute cystitis with hematuria: Secondary | ICD-10-CM | POA: Diagnosis not present

## 2016-04-01 DIAGNOSIS — Y838 Other surgical procedures as the cause of abnormal reaction of the patient, or of later complication, without mention of misadventure at the time of the procedure: Secondary | ICD-10-CM | POA: Diagnosis not present

## 2016-04-01 DIAGNOSIS — R109 Unspecified abdominal pain: Secondary | ICD-10-CM | POA: Diagnosis not present

## 2016-04-01 DIAGNOSIS — T83098A Other mechanical complication of other indwelling urethral catheter, initial encounter: Secondary | ICD-10-CM | POA: Diagnosis not present

## 2016-04-01 DIAGNOSIS — R410 Disorientation, unspecified: Secondary | ICD-10-CM | POA: Diagnosis not present

## 2016-04-02 DIAGNOSIS — C674 Malignant neoplasm of posterior wall of bladder: Secondary | ICD-10-CM | POA: Diagnosis not present

## 2016-04-02 DIAGNOSIS — N3020 Other chronic cystitis without hematuria: Secondary | ICD-10-CM | POA: Diagnosis not present

## 2016-04-02 DIAGNOSIS — N318 Other neuromuscular dysfunction of bladder: Secondary | ICD-10-CM | POA: Diagnosis not present

## 2016-04-09 DIAGNOSIS — N390 Urinary tract infection, site not specified: Secondary | ICD-10-CM | POA: Diagnosis not present

## 2016-04-09 DIAGNOSIS — K219 Gastro-esophageal reflux disease without esophagitis: Secondary | ICD-10-CM | POA: Diagnosis not present

## 2016-04-09 DIAGNOSIS — R410 Disorientation, unspecified: Secondary | ICD-10-CM | POA: Diagnosis not present

## 2016-04-09 DIAGNOSIS — R0789 Other chest pain: Secondary | ICD-10-CM | POA: Diagnosis not present

## 2016-04-15 DIAGNOSIS — R410 Disorientation, unspecified: Secondary | ICD-10-CM | POA: Diagnosis not present

## 2016-04-15 DIAGNOSIS — R339 Retention of urine, unspecified: Secondary | ICD-10-CM | POA: Diagnosis not present

## 2016-04-15 DIAGNOSIS — G4700 Insomnia, unspecified: Secondary | ICD-10-CM | POA: Diagnosis not present

## 2016-04-15 DIAGNOSIS — F200 Paranoid schizophrenia: Secondary | ICD-10-CM | POA: Diagnosis not present

## 2016-04-15 DIAGNOSIS — G2589 Other specified extrapyramidal and movement disorders: Secondary | ICD-10-CM | POA: Diagnosis not present

## 2016-04-15 DIAGNOSIS — N390 Urinary tract infection, site not specified: Secondary | ICD-10-CM | POA: Diagnosis not present

## 2016-04-15 DIAGNOSIS — K219 Gastro-esophageal reflux disease without esophagitis: Secondary | ICD-10-CM | POA: Diagnosis not present

## 2016-04-15 DIAGNOSIS — F411 Generalized anxiety disorder: Secondary | ICD-10-CM | POA: Diagnosis not present

## 2016-04-18 DIAGNOSIS — G40909 Epilepsy, unspecified, not intractable, without status epilepticus: Secondary | ICD-10-CM | POA: Diagnosis not present

## 2016-04-18 DIAGNOSIS — S3993XA Unspecified injury of pelvis, initial encounter: Secondary | ICD-10-CM | POA: Diagnosis not present

## 2016-04-18 DIAGNOSIS — S0990XA Unspecified injury of head, initial encounter: Secondary | ICD-10-CM | POA: Diagnosis not present

## 2016-04-18 DIAGNOSIS — T148XXA Other injury of unspecified body region, initial encounter: Secondary | ICD-10-CM | POA: Diagnosis not present

## 2016-04-18 DIAGNOSIS — M25552 Pain in left hip: Secondary | ICD-10-CM | POA: Diagnosis not present

## 2016-04-18 DIAGNOSIS — R102 Pelvic and perineal pain: Secondary | ICD-10-CM | POA: Diagnosis not present

## 2016-04-18 DIAGNOSIS — W050XXA Fall from non-moving wheelchair, initial encounter: Secondary | ICD-10-CM | POA: Diagnosis not present

## 2016-04-18 DIAGNOSIS — M25559 Pain in unspecified hip: Secondary | ICD-10-CM | POA: Diagnosis not present

## 2016-04-18 DIAGNOSIS — M25551 Pain in right hip: Secondary | ICD-10-CM | POA: Diagnosis not present

## 2016-04-18 DIAGNOSIS — Y9389 Activity, other specified: Secondary | ICD-10-CM | POA: Diagnosis not present

## 2016-04-18 DIAGNOSIS — S7002XA Contusion of left hip, initial encounter: Secondary | ICD-10-CM | POA: Diagnosis not present

## 2016-04-22 DIAGNOSIS — R269 Unspecified abnormalities of gait and mobility: Secondary | ICD-10-CM | POA: Diagnosis not present

## 2016-04-22 DIAGNOSIS — S7002XD Contusion of left hip, subsequent encounter: Secondary | ICD-10-CM | POA: Diagnosis not present

## 2016-04-22 DIAGNOSIS — N3090 Cystitis, unspecified without hematuria: Secondary | ICD-10-CM | POA: Diagnosis not present

## 2016-04-22 DIAGNOSIS — N3020 Other chronic cystitis without hematuria: Secondary | ICD-10-CM | POA: Diagnosis not present

## 2016-04-22 DIAGNOSIS — N318 Other neuromuscular dysfunction of bladder: Secondary | ICD-10-CM | POA: Diagnosis not present

## 2016-05-02 DIAGNOSIS — I10 Essential (primary) hypertension: Secondary | ICD-10-CM | POA: Diagnosis not present

## 2016-05-02 DIAGNOSIS — Z9181 History of falling: Secondary | ICD-10-CM | POA: Diagnosis not present

## 2016-05-02 DIAGNOSIS — Z466 Encounter for fitting and adjustment of urinary device: Secondary | ICD-10-CM | POA: Diagnosis not present

## 2016-05-02 DIAGNOSIS — N319 Neuromuscular dysfunction of bladder, unspecified: Secondary | ICD-10-CM | POA: Diagnosis not present

## 2016-05-02 DIAGNOSIS — R339 Retention of urine, unspecified: Secondary | ICD-10-CM | POA: Diagnosis not present

## 2016-05-04 DIAGNOSIS — S43005S Unspecified dislocation of left shoulder joint, sequela: Secondary | ICD-10-CM | POA: Diagnosis not present

## 2016-05-06 DIAGNOSIS — J208 Acute bronchitis due to other specified organisms: Secondary | ICD-10-CM | POA: Diagnosis not present

## 2016-05-07 DIAGNOSIS — N390 Urinary tract infection, site not specified: Secondary | ICD-10-CM | POA: Diagnosis not present

## 2016-05-07 DIAGNOSIS — T83498A Other mechanical complication of other prosthetic devices, implants and grafts of genital tract, initial encounter: Secondary | ICD-10-CM | POA: Diagnosis not present

## 2016-05-07 DIAGNOSIS — T83198A Other mechanical complication of other urinary devices and implants, initial encounter: Secondary | ICD-10-CM | POA: Diagnosis not present

## 2016-05-07 DIAGNOSIS — T83511A Infection and inflammatory reaction due to indwelling urethral catheter, initial encounter: Secondary | ICD-10-CM | POA: Diagnosis not present

## 2016-05-12 DIAGNOSIS — N390 Urinary tract infection, site not specified: Secondary | ICD-10-CM | POA: Diagnosis not present

## 2016-05-13 DIAGNOSIS — T83511A Infection and inflammatory reaction due to indwelling urethral catheter, initial encounter: Secondary | ICD-10-CM | POA: Diagnosis not present

## 2016-05-13 DIAGNOSIS — Z466 Encounter for fitting and adjustment of urinary device: Secondary | ICD-10-CM | POA: Diagnosis not present

## 2016-05-13 DIAGNOSIS — R2689 Other abnormalities of gait and mobility: Secondary | ICD-10-CM | POA: Diagnosis not present

## 2016-05-13 DIAGNOSIS — N390 Urinary tract infection, site not specified: Secondary | ICD-10-CM | POA: Diagnosis not present

## 2016-05-28 DIAGNOSIS — Z466 Encounter for fitting and adjustment of urinary device: Secondary | ICD-10-CM | POA: Diagnosis not present

## 2016-05-28 DIAGNOSIS — I10 Essential (primary) hypertension: Secondary | ICD-10-CM | POA: Diagnosis not present

## 2016-05-28 DIAGNOSIS — N319 Neuromuscular dysfunction of bladder, unspecified: Secondary | ICD-10-CM | POA: Diagnosis not present

## 2016-05-28 DIAGNOSIS — R339 Retention of urine, unspecified: Secondary | ICD-10-CM | POA: Diagnosis not present

## 2016-06-01 DIAGNOSIS — N3090 Cystitis, unspecified without hematuria: Secondary | ICD-10-CM | POA: Diagnosis not present

## 2016-06-01 DIAGNOSIS — N318 Other neuromuscular dysfunction of bladder: Secondary | ICD-10-CM | POA: Diagnosis not present

## 2016-06-01 DIAGNOSIS — N3020 Other chronic cystitis without hematuria: Secondary | ICD-10-CM | POA: Diagnosis not present

## 2016-06-03 DIAGNOSIS — R0602 Shortness of breath: Secondary | ICD-10-CM | POA: Diagnosis not present

## 2016-06-03 DIAGNOSIS — R569 Unspecified convulsions: Secondary | ICD-10-CM | POA: Diagnosis not present

## 2016-06-03 DIAGNOSIS — R319 Hematuria, unspecified: Secondary | ICD-10-CM | POA: Diagnosis not present

## 2016-06-03 DIAGNOSIS — N390 Urinary tract infection, site not specified: Secondary | ICD-10-CM | POA: Diagnosis not present

## 2016-06-03 DIAGNOSIS — S098XXA Other specified injuries of head, initial encounter: Secondary | ICD-10-CM | POA: Diagnosis not present

## 2016-06-03 DIAGNOSIS — S199XXA Unspecified injury of neck, initial encounter: Secondary | ICD-10-CM | POA: Diagnosis not present

## 2016-06-03 DIAGNOSIS — S0093XA Contusion of unspecified part of head, initial encounter: Secondary | ICD-10-CM | POA: Diagnosis not present

## 2016-06-03 DIAGNOSIS — S064X0A Epidural hemorrhage without loss of consciousness, initial encounter: Secondary | ICD-10-CM | POA: Diagnosis not present

## 2016-06-03 DIAGNOSIS — I10 Essential (primary) hypertension: Secondary | ICD-10-CM | POA: Diagnosis not present

## 2016-06-03 DIAGNOSIS — R339 Retention of urine, unspecified: Secondary | ICD-10-CM | POA: Diagnosis not present

## 2016-06-03 DIAGNOSIS — S0990XA Unspecified injury of head, initial encounter: Secondary | ICD-10-CM | POA: Diagnosis not present

## 2016-06-03 DIAGNOSIS — W19XXXA Unspecified fall, initial encounter: Secondary | ICD-10-CM | POA: Diagnosis not present

## 2016-06-06 DIAGNOSIS — T83091A Other mechanical complication of indwelling urethral catheter, initial encounter: Secondary | ICD-10-CM | POA: Diagnosis not present

## 2016-06-06 DIAGNOSIS — R58 Hemorrhage, not elsewhere classified: Secondary | ICD-10-CM | POA: Diagnosis not present

## 2016-06-07 DIAGNOSIS — T839XXA Unspecified complication of genitourinary prosthetic device, implant and graft, initial encounter: Secondary | ICD-10-CM | POA: Diagnosis not present

## 2016-06-15 DIAGNOSIS — E782 Mixed hyperlipidemia: Secondary | ICD-10-CM | POA: Diagnosis not present

## 2016-06-15 DIAGNOSIS — D518 Other vitamin B12 deficiency anemias: Secondary | ICD-10-CM | POA: Diagnosis not present

## 2016-06-15 DIAGNOSIS — Z79899 Other long term (current) drug therapy: Secondary | ICD-10-CM | POA: Diagnosis not present

## 2016-06-15 DIAGNOSIS — E038 Other specified hypothyroidism: Secondary | ICD-10-CM | POA: Diagnosis not present

## 2016-06-15 DIAGNOSIS — E119 Type 2 diabetes mellitus without complications: Secondary | ICD-10-CM | POA: Diagnosis not present

## 2016-06-15 DIAGNOSIS — E559 Vitamin D deficiency, unspecified: Secondary | ICD-10-CM | POA: Diagnosis not present

## 2016-06-17 DIAGNOSIS — I10 Essential (primary) hypertension: Secondary | ICD-10-CM | POA: Diagnosis not present

## 2016-06-17 DIAGNOSIS — R339 Retention of urine, unspecified: Secondary | ICD-10-CM | POA: Diagnosis not present

## 2016-06-17 DIAGNOSIS — N390 Urinary tract infection, site not specified: Secondary | ICD-10-CM | POA: Diagnosis not present

## 2016-06-17 DIAGNOSIS — J069 Acute upper respiratory infection, unspecified: Secondary | ICD-10-CM | POA: Diagnosis not present

## 2016-07-07 DIAGNOSIS — F200 Paranoid schizophrenia: Secondary | ICD-10-CM | POA: Diagnosis not present

## 2016-07-07 DIAGNOSIS — F411 Generalized anxiety disorder: Secondary | ICD-10-CM | POA: Diagnosis not present

## 2016-07-07 DIAGNOSIS — G2589 Other specified extrapyramidal and movement disorders: Secondary | ICD-10-CM | POA: Diagnosis not present

## 2016-07-07 DIAGNOSIS — G4700 Insomnia, unspecified: Secondary | ICD-10-CM | POA: Diagnosis not present

## 2016-07-09 DIAGNOSIS — R0602 Shortness of breath: Secondary | ICD-10-CM | POA: Diagnosis not present

## 2016-07-09 DIAGNOSIS — Y849 Medical procedure, unspecified as the cause of abnormal reaction of the patient, or of later complication, without mention of misadventure at the time of the procedure: Secondary | ICD-10-CM | POA: Diagnosis not present

## 2016-07-09 DIAGNOSIS — Z7982 Long term (current) use of aspirin: Secondary | ICD-10-CM | POA: Diagnosis not present

## 2016-07-09 DIAGNOSIS — Z79899 Other long term (current) drug therapy: Secondary | ICD-10-CM | POA: Diagnosis not present

## 2016-07-09 DIAGNOSIS — R531 Weakness: Secondary | ICD-10-CM | POA: Diagnosis not present

## 2016-07-09 DIAGNOSIS — T839XXA Unspecified complication of genitourinary prosthetic device, implant and graft, initial encounter: Secondary | ICD-10-CM | POA: Diagnosis not present

## 2016-07-09 DIAGNOSIS — Z87891 Personal history of nicotine dependence: Secondary | ICD-10-CM | POA: Diagnosis not present

## 2016-07-09 DIAGNOSIS — T83091A Other mechanical complication of indwelling urethral catheter, initial encounter: Secondary | ICD-10-CM | POA: Diagnosis not present

## 2016-07-09 DIAGNOSIS — Z792 Long term (current) use of antibiotics: Secondary | ICD-10-CM | POA: Diagnosis not present

## 2016-07-09 DIAGNOSIS — I10 Essential (primary) hypertension: Secondary | ICD-10-CM | POA: Diagnosis not present

## 2016-07-10 DIAGNOSIS — I10 Essential (primary) hypertension: Secondary | ICD-10-CM | POA: Diagnosis not present

## 2016-07-10 DIAGNOSIS — R339 Retention of urine, unspecified: Secondary | ICD-10-CM | POA: Diagnosis not present

## 2016-07-10 DIAGNOSIS — N319 Neuromuscular dysfunction of bladder, unspecified: Secondary | ICD-10-CM | POA: Diagnosis not present

## 2016-07-14 DIAGNOSIS — K219 Gastro-esophageal reflux disease without esophagitis: Secondary | ICD-10-CM | POA: Diagnosis not present

## 2016-07-14 DIAGNOSIS — I10 Essential (primary) hypertension: Secondary | ICD-10-CM | POA: Diagnosis not present

## 2016-07-14 DIAGNOSIS — R569 Unspecified convulsions: Secondary | ICD-10-CM | POA: Diagnosis not present

## 2016-07-14 DIAGNOSIS — N390 Urinary tract infection, site not specified: Secondary | ICD-10-CM | POA: Diagnosis not present

## 2016-07-17 DIAGNOSIS — Z466 Encounter for fitting and adjustment of urinary device: Secondary | ICD-10-CM | POA: Diagnosis not present

## 2016-07-17 DIAGNOSIS — R1084 Generalized abdominal pain: Secondary | ICD-10-CM | POA: Diagnosis not present

## 2016-07-17 DIAGNOSIS — R531 Weakness: Secondary | ICD-10-CM | POA: Diagnosis not present

## 2016-07-21 DIAGNOSIS — N3090 Cystitis, unspecified without hematuria: Secondary | ICD-10-CM | POA: Diagnosis not present

## 2016-07-21 DIAGNOSIS — N3000 Acute cystitis without hematuria: Secondary | ICD-10-CM | POA: Diagnosis not present

## 2016-07-21 DIAGNOSIS — N318 Other neuromuscular dysfunction of bladder: Secondary | ICD-10-CM | POA: Diagnosis not present

## 2016-07-21 DIAGNOSIS — C671 Malignant neoplasm of dome of bladder: Secondary | ICD-10-CM | POA: Diagnosis not present

## 2016-07-21 DIAGNOSIS — N3001 Acute cystitis with hematuria: Secondary | ICD-10-CM | POA: Diagnosis not present

## 2016-07-29 DIAGNOSIS — N319 Neuromuscular dysfunction of bladder, unspecified: Secondary | ICD-10-CM | POA: Diagnosis not present

## 2016-07-29 DIAGNOSIS — I10 Essential (primary) hypertension: Secondary | ICD-10-CM | POA: Diagnosis not present

## 2016-07-29 DIAGNOSIS — Z466 Encounter for fitting and adjustment of urinary device: Secondary | ICD-10-CM | POA: Diagnosis not present

## 2016-07-29 DIAGNOSIS — R339 Retention of urine, unspecified: Secondary | ICD-10-CM | POA: Diagnosis not present

## 2016-07-29 DIAGNOSIS — Z9181 History of falling: Secondary | ICD-10-CM | POA: Diagnosis not present

## 2016-07-30 DIAGNOSIS — R531 Weakness: Secondary | ICD-10-CM | POA: Diagnosis not present

## 2016-07-30 DIAGNOSIS — T83091A Other mechanical complication of indwelling urethral catheter, initial encounter: Secondary | ICD-10-CM | POA: Diagnosis not present

## 2016-07-30 DIAGNOSIS — Z466 Encounter for fitting and adjustment of urinary device: Secondary | ICD-10-CM | POA: Diagnosis not present

## 2016-08-05 DIAGNOSIS — R339 Retention of urine, unspecified: Secondary | ICD-10-CM | POA: Diagnosis not present

## 2016-08-05 DIAGNOSIS — N390 Urinary tract infection, site not specified: Secondary | ICD-10-CM | POA: Diagnosis not present

## 2016-08-27 DIAGNOSIS — R8271 Bacteriuria: Secondary | ICD-10-CM | POA: Diagnosis not present

## 2016-08-27 DIAGNOSIS — T83091A Other mechanical complication of indwelling urethral catheter, initial encounter: Secondary | ICD-10-CM | POA: Diagnosis not present

## 2016-08-27 DIAGNOSIS — T83021A Displacement of indwelling urethral catheter, initial encounter: Secondary | ICD-10-CM | POA: Diagnosis not present

## 2016-08-27 DIAGNOSIS — S3994XA Unspecified injury of external genitals, initial encounter: Secondary | ICD-10-CM | POA: Diagnosis not present

## 2016-08-27 DIAGNOSIS — T63091A Toxic effect of venom of other snake, accidental (unintentional), initial encounter: Secondary | ICD-10-CM | POA: Diagnosis not present

## 2016-08-27 DIAGNOSIS — S3994 Unspecified injury of external genitals: Secondary | ICD-10-CM | POA: Diagnosis not present

## 2016-09-17 DIAGNOSIS — R339 Retention of urine, unspecified: Secondary | ICD-10-CM | POA: Diagnosis not present

## 2016-09-17 DIAGNOSIS — I10 Essential (primary) hypertension: Secondary | ICD-10-CM | POA: Diagnosis not present

## 2016-09-17 DIAGNOSIS — Z466 Encounter for fitting and adjustment of urinary device: Secondary | ICD-10-CM | POA: Diagnosis not present

## 2016-09-17 DIAGNOSIS — N319 Neuromuscular dysfunction of bladder, unspecified: Secondary | ICD-10-CM | POA: Diagnosis not present

## 2016-09-25 DIAGNOSIS — Z9181 History of falling: Secondary | ICD-10-CM | POA: Diagnosis not present

## 2016-09-25 DIAGNOSIS — R339 Retention of urine, unspecified: Secondary | ICD-10-CM | POA: Diagnosis not present

## 2016-09-25 DIAGNOSIS — N319 Neuromuscular dysfunction of bladder, unspecified: Secondary | ICD-10-CM | POA: Diagnosis not present

## 2016-09-25 DIAGNOSIS — Z466 Encounter for fitting and adjustment of urinary device: Secondary | ICD-10-CM | POA: Diagnosis not present

## 2016-09-25 DIAGNOSIS — I10 Essential (primary) hypertension: Secondary | ICD-10-CM | POA: Diagnosis not present

## 2016-09-27 DIAGNOSIS — T83021A Displacement of indwelling urethral catheter, initial encounter: Secondary | ICD-10-CM | POA: Diagnosis not present

## 2016-09-27 DIAGNOSIS — T83091A Other mechanical complication of indwelling urethral catheter, initial encounter: Secondary | ICD-10-CM | POA: Diagnosis not present

## 2016-09-27 DIAGNOSIS — S3994XA Unspecified injury of external genitals, initial encounter: Secondary | ICD-10-CM | POA: Diagnosis not present

## 2016-09-28 DIAGNOSIS — E038 Other specified hypothyroidism: Secondary | ICD-10-CM | POA: Diagnosis not present

## 2016-09-28 DIAGNOSIS — D518 Other vitamin B12 deficiency anemias: Secondary | ICD-10-CM | POA: Diagnosis not present

## 2016-09-28 DIAGNOSIS — Z79899 Other long term (current) drug therapy: Secondary | ICD-10-CM | POA: Diagnosis not present

## 2016-09-28 DIAGNOSIS — E119 Type 2 diabetes mellitus without complications: Secondary | ICD-10-CM | POA: Diagnosis not present

## 2016-09-28 DIAGNOSIS — E782 Mixed hyperlipidemia: Secondary | ICD-10-CM | POA: Diagnosis not present

## 2016-09-28 DIAGNOSIS — E559 Vitamin D deficiency, unspecified: Secondary | ICD-10-CM | POA: Diagnosis not present

## 2016-09-30 DIAGNOSIS — R339 Retention of urine, unspecified: Secondary | ICD-10-CM | POA: Diagnosis not present

## 2016-09-30 DIAGNOSIS — N400 Enlarged prostate without lower urinary tract symptoms: Secondary | ICD-10-CM | POA: Diagnosis not present

## 2016-09-30 DIAGNOSIS — L84 Corns and callosities: Secondary | ICD-10-CM | POA: Diagnosis not present

## 2016-09-30 DIAGNOSIS — K219 Gastro-esophageal reflux disease without esophagitis: Secondary | ICD-10-CM | POA: Diagnosis not present

## 2016-10-24 DIAGNOSIS — T83498A Other mechanical complication of other prosthetic devices, implants and grafts of genital tract, initial encounter: Secondary | ICD-10-CM | POA: Diagnosis not present

## 2016-10-24 DIAGNOSIS — T83198A Other mechanical complication of other urinary devices and implants, initial encounter: Secondary | ICD-10-CM | POA: Diagnosis not present

## 2016-10-24 DIAGNOSIS — X58XXXA Exposure to other specified factors, initial encounter: Secondary | ICD-10-CM | POA: Diagnosis not present

## 2016-10-24 DIAGNOSIS — T83021A Displacement of indwelling urethral catheter, initial encounter: Secondary | ICD-10-CM | POA: Diagnosis not present

## 2016-10-28 DIAGNOSIS — K219 Gastro-esophageal reflux disease without esophagitis: Secondary | ICD-10-CM | POA: Diagnosis not present

## 2016-10-28 DIAGNOSIS — N400 Enlarged prostate without lower urinary tract symptoms: Secondary | ICD-10-CM | POA: Diagnosis not present

## 2016-10-28 DIAGNOSIS — R339 Retention of urine, unspecified: Secondary | ICD-10-CM | POA: Diagnosis not present

## 2016-10-28 DIAGNOSIS — R569 Unspecified convulsions: Secondary | ICD-10-CM | POA: Diagnosis not present

## 2016-11-03 DIAGNOSIS — C671 Malignant neoplasm of dome of bladder: Secondary | ICD-10-CM | POA: Diagnosis not present

## 2016-11-03 DIAGNOSIS — N318 Other neuromuscular dysfunction of bladder: Secondary | ICD-10-CM | POA: Diagnosis not present

## 2016-11-03 DIAGNOSIS — N3020 Other chronic cystitis without hematuria: Secondary | ICD-10-CM | POA: Diagnosis not present

## 2016-11-03 DIAGNOSIS — N3001 Acute cystitis with hematuria: Secondary | ICD-10-CM | POA: Diagnosis not present

## 2016-11-05 DIAGNOSIS — R0602 Shortness of breath: Secondary | ICD-10-CM | POA: Diagnosis not present

## 2016-11-05 DIAGNOSIS — S43005A Unspecified dislocation of left shoulder joint, initial encounter: Secondary | ICD-10-CM | POA: Diagnosis not present

## 2016-11-05 DIAGNOSIS — K219 Gastro-esophageal reflux disease without esophagitis: Secondary | ICD-10-CM | POA: Diagnosis not present

## 2016-11-05 DIAGNOSIS — I10 Essential (primary) hypertension: Secondary | ICD-10-CM | POA: Diagnosis not present

## 2016-11-05 DIAGNOSIS — R079 Chest pain, unspecified: Secondary | ICD-10-CM | POA: Diagnosis not present

## 2016-11-06 DIAGNOSIS — R079 Chest pain, unspecified: Secondary | ICD-10-CM | POA: Diagnosis not present

## 2016-11-06 DIAGNOSIS — I10 Essential (primary) hypertension: Secondary | ICD-10-CM | POA: Diagnosis not present

## 2016-11-16 DIAGNOSIS — N319 Neuromuscular dysfunction of bladder, unspecified: Secondary | ICD-10-CM | POA: Diagnosis not present

## 2016-11-16 DIAGNOSIS — I10 Essential (primary) hypertension: Secondary | ICD-10-CM | POA: Diagnosis not present

## 2016-11-16 DIAGNOSIS — Z466 Encounter for fitting and adjustment of urinary device: Secondary | ICD-10-CM | POA: Diagnosis not present

## 2016-11-16 DIAGNOSIS — R339 Retention of urine, unspecified: Secondary | ICD-10-CM | POA: Diagnosis not present

## 2016-11-26 ENCOUNTER — Ambulatory Visit: Payer: Self-pay | Admitting: Sports Medicine

## 2016-11-26 DIAGNOSIS — N319 Neuromuscular dysfunction of bladder, unspecified: Secondary | ICD-10-CM | POA: Diagnosis not present

## 2016-11-26 DIAGNOSIS — R339 Retention of urine, unspecified: Secondary | ICD-10-CM | POA: Diagnosis not present

## 2016-11-26 DIAGNOSIS — I10 Essential (primary) hypertension: Secondary | ICD-10-CM | POA: Diagnosis not present

## 2016-11-26 DIAGNOSIS — Z466 Encounter for fitting and adjustment of urinary device: Secondary | ICD-10-CM | POA: Diagnosis not present

## 2016-11-26 DIAGNOSIS — Z9181 History of falling: Secondary | ICD-10-CM | POA: Diagnosis not present

## 2016-12-04 DIAGNOSIS — T83021A Displacement of indwelling urethral catheter, initial encounter: Secondary | ICD-10-CM | POA: Diagnosis not present

## 2016-12-08 DIAGNOSIS — F200 Paranoid schizophrenia: Secondary | ICD-10-CM | POA: Diagnosis not present

## 2016-12-08 DIAGNOSIS — G2589 Other specified extrapyramidal and movement disorders: Secondary | ICD-10-CM | POA: Diagnosis not present

## 2016-12-08 DIAGNOSIS — F411 Generalized anxiety disorder: Secondary | ICD-10-CM | POA: Diagnosis not present

## 2016-12-08 DIAGNOSIS — G4700 Insomnia, unspecified: Secondary | ICD-10-CM | POA: Diagnosis not present

## 2016-12-11 ENCOUNTER — Ambulatory Visit: Payer: Self-pay | Admitting: Sports Medicine

## 2016-12-14 DIAGNOSIS — I10 Essential (primary) hypertension: Secondary | ICD-10-CM | POA: Diagnosis not present

## 2016-12-14 DIAGNOSIS — Z466 Encounter for fitting and adjustment of urinary device: Secondary | ICD-10-CM | POA: Diagnosis not present

## 2016-12-14 DIAGNOSIS — R339 Retention of urine, unspecified: Secondary | ICD-10-CM | POA: Diagnosis not present

## 2016-12-14 DIAGNOSIS — N319 Neuromuscular dysfunction of bladder, unspecified: Secondary | ICD-10-CM | POA: Diagnosis not present

## 2016-12-21 DIAGNOSIS — Z79899 Other long term (current) drug therapy: Secondary | ICD-10-CM | POA: Diagnosis not present

## 2016-12-21 DIAGNOSIS — E119 Type 2 diabetes mellitus without complications: Secondary | ICD-10-CM | POA: Diagnosis not present

## 2016-12-21 DIAGNOSIS — E038 Other specified hypothyroidism: Secondary | ICD-10-CM | POA: Diagnosis not present

## 2016-12-21 DIAGNOSIS — D518 Other vitamin B12 deficiency anemias: Secondary | ICD-10-CM | POA: Diagnosis not present

## 2016-12-21 DIAGNOSIS — E782 Mixed hyperlipidemia: Secondary | ICD-10-CM | POA: Diagnosis not present

## 2016-12-21 DIAGNOSIS — E559 Vitamin D deficiency, unspecified: Secondary | ICD-10-CM | POA: Diagnosis not present

## 2017-01-06 DIAGNOSIS — G4700 Insomnia, unspecified: Secondary | ICD-10-CM | POA: Diagnosis not present

## 2017-01-06 DIAGNOSIS — F411 Generalized anxiety disorder: Secondary | ICD-10-CM | POA: Diagnosis not present

## 2017-01-06 DIAGNOSIS — G2589 Other specified extrapyramidal and movement disorders: Secondary | ICD-10-CM | POA: Diagnosis not present

## 2017-01-06 DIAGNOSIS — F200 Paranoid schizophrenia: Secondary | ICD-10-CM | POA: Diagnosis not present

## 2017-01-07 ENCOUNTER — Encounter: Payer: Self-pay | Admitting: Sports Medicine

## 2017-01-07 ENCOUNTER — Ambulatory Visit (INDEPENDENT_AMBULATORY_CARE_PROVIDER_SITE_OTHER): Payer: Medicaid Other | Admitting: Sports Medicine

## 2017-01-07 DIAGNOSIS — M79674 Pain in right toe(s): Secondary | ICD-10-CM

## 2017-01-07 DIAGNOSIS — Q828 Other specified congenital malformations of skin: Secondary | ICD-10-CM

## 2017-01-07 DIAGNOSIS — M79675 Pain in left toe(s): Secondary | ICD-10-CM

## 2017-01-07 DIAGNOSIS — B351 Tinea unguium: Secondary | ICD-10-CM

## 2017-01-07 NOTE — Progress Notes (Signed)
Subjective: Wesley Howard is a 76 y.o. male patient seen today in office with complaint of painful thickened and elongated toenails and callus; unable to trim. Patient denies history of Diabetes, Neuropathy, or Vascular disease. [facility]. Patient is assisted by facility caregiver and has no other pedal complaints at this time.   There are no active problems to display for this patient.   No current outpatient prescriptions on file prior to visit.   No current facility-administered medications on file prior to visit.     Allergies  Allergen Reactions  . Thioridazine Other (See Comments)    Unknown reaction     Objective: Physical Exam  General: Well developed, nourished, no acute distress, awake, alert and oriented x 2. Uses wheelchair.   Vascular: Dorsalis pedis artery 2/4 bilateral, Posterior tibial artery 1/4 bilateral, skin temperature warm to warm proximal to distal bilateral lower extremities, no varicosities, pedal hair present bilateral.  Neurological: Gross sensation present via light touch bilateral.   Dermatological: Old scar at right medial ankle, that is well healed. Skin is warm, dry, and supple bilateral, Nails 1-10 are tender, long, thick, and discolored with moderate subungal debris, no webspace macerations present bilateral, no open lesions present bilateral,+ callus/hyperkeratotic tissue present sub met 5 and hallux on right with mild dry heme. No underlying opening. No signs of infection bilateral.  Musculoskeletal: No symptomatic boney deformities noted bilateral. Muscular strength within normal limits without painon range of motion. No pain with calf compression bilateral.  Assessment and Plan:  Problem List Items Addressed This Visit    None    Visit Diagnoses    Pain due to onychomycosis of toenails of both feet    -  Primary   Porokeratosis         -Examined patient.  -Discussed treatment options for painful mycotic nails and  callus . -Mechanically debrided callus x 2 using sterile chisel blade and reduced mycotic nails with sterile nail nipper and dremel nail file without incident. -Continue with good supportive shoes and wheelchair to assist with gait.  -Patient to return in 3 months for follow up evaluation or sooner if symptoms worsen.  Landis Martins, DPM

## 2017-01-25 DIAGNOSIS — Z466 Encounter for fitting and adjustment of urinary device: Secondary | ICD-10-CM | POA: Diagnosis not present

## 2017-01-25 DIAGNOSIS — N319 Neuromuscular dysfunction of bladder, unspecified: Secondary | ICD-10-CM | POA: Diagnosis not present

## 2017-01-25 DIAGNOSIS — Z9181 History of falling: Secondary | ICD-10-CM | POA: Diagnosis not present

## 2017-01-25 DIAGNOSIS — I10 Essential (primary) hypertension: Secondary | ICD-10-CM | POA: Diagnosis not present

## 2017-01-25 DIAGNOSIS — R339 Retention of urine, unspecified: Secondary | ICD-10-CM | POA: Diagnosis not present

## 2017-01-28 DIAGNOSIS — T839XXA Unspecified complication of genitourinary prosthetic device, implant and graft, initial encounter: Secondary | ICD-10-CM | POA: Diagnosis not present

## 2017-01-28 DIAGNOSIS — R319 Hematuria, unspecified: Secondary | ICD-10-CM | POA: Diagnosis not present

## 2017-01-28 DIAGNOSIS — T83098A Other mechanical complication of other indwelling urethral catheter, initial encounter: Secondary | ICD-10-CM | POA: Diagnosis not present

## 2017-02-02 DIAGNOSIS — K219 Gastro-esophageal reflux disease without esophagitis: Secondary | ICD-10-CM | POA: Diagnosis not present

## 2017-02-02 DIAGNOSIS — N189 Chronic kidney disease, unspecified: Secondary | ICD-10-CM | POA: Diagnosis not present

## 2017-02-02 DIAGNOSIS — I10 Essential (primary) hypertension: Secondary | ICD-10-CM | POA: Diagnosis not present

## 2017-02-02 DIAGNOSIS — N319 Neuromuscular dysfunction of bladder, unspecified: Secondary | ICD-10-CM | POA: Diagnosis not present

## 2017-02-08 DIAGNOSIS — Z466 Encounter for fitting and adjustment of urinary device: Secondary | ICD-10-CM | POA: Diagnosis not present

## 2017-02-08 DIAGNOSIS — R339 Retention of urine, unspecified: Secondary | ICD-10-CM | POA: Diagnosis not present

## 2017-02-08 DIAGNOSIS — N319 Neuromuscular dysfunction of bladder, unspecified: Secondary | ICD-10-CM | POA: Diagnosis not present

## 2017-02-08 DIAGNOSIS — I10 Essential (primary) hypertension: Secondary | ICD-10-CM | POA: Diagnosis not present

## 2017-02-19 ENCOUNTER — Encounter (HOSPITAL_COMMUNITY): Payer: Self-pay | Admitting: Internal Medicine

## 2017-02-19 ENCOUNTER — Other Ambulatory Visit (HOSPITAL_COMMUNITY): Payer: PPO

## 2017-02-19 ENCOUNTER — Inpatient Hospital Stay (HOSPITAL_COMMUNITY): Payer: PPO

## 2017-02-19 ENCOUNTER — Emergency Department (HOSPITAL_COMMUNITY): Payer: PPO

## 2017-02-19 ENCOUNTER — Inpatient Hospital Stay (HOSPITAL_COMMUNITY)
Admission: EM | Admit: 2017-02-19 | Discharge: 2017-02-22 | DRG: 083 | Disposition: A | Discharge status: Home / Self Care | Payer: PPO | Attending: Family Medicine | Admitting: Family Medicine

## 2017-02-19 DIAGNOSIS — S0990XA Unspecified injury of head, initial encounter: Secondary | ICD-10-CM | POA: Diagnosis not present

## 2017-02-19 DIAGNOSIS — I609 Nontraumatic subarachnoid hemorrhage, unspecified: Secondary | ICD-10-CM

## 2017-02-19 DIAGNOSIS — G250 Essential tremor: Secondary | ICD-10-CM | POA: Diagnosis present

## 2017-02-19 DIAGNOSIS — Z66 Do not resuscitate: Secondary | ICD-10-CM | POA: Diagnosis present

## 2017-02-19 DIAGNOSIS — Z79899 Other long term (current) drug therapy: Secondary | ICD-10-CM

## 2017-02-19 DIAGNOSIS — N3001 Acute cystitis with hematuria: Secondary | ICD-10-CM | POA: Diagnosis not present

## 2017-02-19 DIAGNOSIS — S066X9A Traumatic subarachnoid hemorrhage with loss of consciousness of unspecified duration, initial encounter: Secondary | ICD-10-CM | POA: Diagnosis not present

## 2017-02-19 DIAGNOSIS — T68XXXA Hypothermia, initial encounter: Secondary | ICD-10-CM | POA: Diagnosis not present

## 2017-02-19 DIAGNOSIS — F0390 Unspecified dementia without behavioral disturbance: Secondary | ICD-10-CM | POA: Diagnosis not present

## 2017-02-19 DIAGNOSIS — S098XXA Other specified injuries of head, initial encounter: Secondary | ICD-10-CM | POA: Diagnosis not present

## 2017-02-19 DIAGNOSIS — N179 Acute kidney failure, unspecified: Secondary | ICD-10-CM

## 2017-02-19 DIAGNOSIS — I10 Essential (primary) hypertension: Secondary | ICD-10-CM | POA: Diagnosis not present

## 2017-02-19 DIAGNOSIS — W19XXXA Unspecified fall, initial encounter: Secondary | ICD-10-CM | POA: Diagnosis present

## 2017-02-19 DIAGNOSIS — Z888 Allergy status to other drugs, medicaments and biological substances status: Secondary | ICD-10-CM | POA: Diagnosis not present

## 2017-02-19 DIAGNOSIS — E039 Hypothyroidism, unspecified: Secondary | ICD-10-CM | POA: Diagnosis present

## 2017-02-19 DIAGNOSIS — G40909 Epilepsy, unspecified, not intractable, without status epilepticus: Secondary | ICD-10-CM | POA: Diagnosis not present

## 2017-02-19 DIAGNOSIS — Z7982 Long term (current) use of aspirin: Secondary | ICD-10-CM | POA: Diagnosis not present

## 2017-02-19 DIAGNOSIS — J9811 Atelectasis: Secondary | ICD-10-CM | POA: Diagnosis not present

## 2017-02-19 DIAGNOSIS — Z515 Encounter for palliative care: Secondary | ICD-10-CM | POA: Diagnosis not present

## 2017-02-19 DIAGNOSIS — N390 Urinary tract infection, site not specified: Secondary | ICD-10-CM | POA: Diagnosis not present

## 2017-02-19 DIAGNOSIS — M24412 Recurrent dislocation, left shoulder: Secondary | ICD-10-CM | POA: Diagnosis not present

## 2017-02-19 DIAGNOSIS — Z993 Dependence on wheelchair: Secondary | ICD-10-CM | POA: Diagnosis not present

## 2017-02-19 DIAGNOSIS — F200 Paranoid schizophrenia: Secondary | ICD-10-CM | POA: Diagnosis not present

## 2017-02-19 DIAGNOSIS — S43015A Anterior dislocation of left humerus, initial encounter: Secondary | ICD-10-CM | POA: Diagnosis not present

## 2017-02-19 DIAGNOSIS — Z8249 Family history of ischemic heart disease and other diseases of the circulatory system: Secondary | ICD-10-CM

## 2017-02-19 DIAGNOSIS — R1310 Dysphagia, unspecified: Secondary | ICD-10-CM | POA: Diagnosis present

## 2017-02-19 DIAGNOSIS — S199XXA Unspecified injury of neck, initial encounter: Secondary | ICD-10-CM | POA: Diagnosis not present

## 2017-02-19 DIAGNOSIS — S0181XA Laceration without foreign body of other part of head, initial encounter: Secondary | ICD-10-CM | POA: Diagnosis not present

## 2017-02-19 HISTORY — DX: Unspecified convulsions: R56.9

## 2017-02-19 HISTORY — DX: Retention of urine, unspecified: R33.9

## 2017-02-19 HISTORY — DX: Essential (primary) hypertension: I10

## 2017-02-19 HISTORY — DX: Hypothyroidism, unspecified: E03.9

## 2017-02-19 HISTORY — DX: Paranoid schizophrenia: F20.0

## 2017-02-19 HISTORY — DX: Tremor, unspecified: R25.1

## 2017-02-19 LAB — URINALYSIS, ROUTINE W REFLEX MICROSCOPIC
BILIRUBIN URINE: NEGATIVE
GLUCOSE, UA: NEGATIVE mg/dL
Ketones, ur: NEGATIVE mg/dL
NITRITE: POSITIVE — AB
Protein, ur: 30 mg/dL — AB
Specific Gravity, Urine: 1.012 (ref 1.005–1.030)
Squamous Epithelial / LPF: NONE SEEN
pH: 7 (ref 5.0–8.0)

## 2017-02-19 LAB — COMPREHENSIVE METABOLIC PANEL
ALBUMIN: 3.8 g/dL (ref 3.5–5.0)
ALT: 12 U/L — ABNORMAL LOW (ref 17–63)
ANION GAP: 7 (ref 5–15)
AST: 17 U/L (ref 15–41)
Alkaline Phosphatase: 84 U/L (ref 38–126)
BUN: 23 mg/dL — ABNORMAL HIGH (ref 6–20)
CO2: 25 mmol/L (ref 22–32)
Calcium: 9.1 mg/dL (ref 8.9–10.3)
Chloride: 101 mmol/L (ref 101–111)
Creatinine, Ser: 1.74 mg/dL — ABNORMAL HIGH (ref 0.61–1.24)
GFR calc Af Amer: 43 mL/min — ABNORMAL LOW (ref >60)
GFR calc non Af Amer: 37 mL/min — ABNORMAL LOW (ref >60)
GLUCOSE: 116 mg/dL — AB (ref 65–99)
POTASSIUM: 3.8 mmol/L (ref 3.5–5.1)
SODIUM: 133 mmol/L — AB (ref 135–145)
TOTAL PROTEIN: 6.9 g/dL (ref 6.5–8.1)
Total Bilirubin: 0.9 mg/dL (ref 0.3–1.2)

## 2017-02-19 LAB — I-STAT TROPONIN, ED: Troponin i, poc: 0.01 ng/mL (ref 0.00–0.08)

## 2017-02-19 LAB — CBC WITH DIFFERENTIAL/PLATELET
BASOS PCT: 0 %
Basophils Absolute: 0 10*3/uL (ref 0.0–0.1)
Eosinophils Absolute: 0 10*3/uL (ref 0.0–0.7)
Eosinophils Relative: 0 %
HEMATOCRIT: 37.8 % — AB (ref 39.0–52.0)
HEMOGLOBIN: 13 g/dL (ref 13.0–17.0)
LYMPHS ABS: 0.5 10*3/uL — AB (ref 0.7–4.0)
Lymphocytes Relative: 4 %
MCH: 31.9 pg (ref 26.0–34.0)
MCHC: 34.4 g/dL (ref 30.0–36.0)
MCV: 92.6 fL (ref 78.0–100.0)
MONOS PCT: 6 %
Monocytes Absolute: 0.7 10*3/uL (ref 0.1–1.0)
NEUTROS ABS: 11 10*3/uL — AB (ref 1.7–7.7)
NEUTROS PCT: 90 %
Platelets: 187 10*3/uL (ref 150–400)
RBC: 4.08 MIL/uL — AB (ref 4.22–5.81)
RDW: 14.1 % (ref 11.5–15.5)
WBC: 12.2 10*3/uL — ABNORMAL HIGH (ref 4.0–10.5)

## 2017-02-19 LAB — APTT: aPTT: 30 seconds (ref 24–36)

## 2017-02-19 LAB — I-STAT CG4 LACTIC ACID, ED: Lactic Acid, Venous: 1.53 mmol/L (ref 0.5–1.9)

## 2017-02-19 LAB — SODIUM, URINE, RANDOM: SODIUM UR: 93 mmol/L

## 2017-02-19 LAB — PROTIME-INR
INR: 1.1
PROTHROMBIN TIME: 14.1 s (ref 11.4–15.2)

## 2017-02-19 MED ORDER — SODIUM CHLORIDE 0.9 % IV SOLN
500.0000 mg | Freq: Two times a day (BID) | INTRAVENOUS | Status: DC
  Administered 2017-02-19 – 2017-02-21 (×6): 500 mg via INTRAVENOUS
  Filled 2017-02-19 (×6): qty 5

## 2017-02-19 MED ORDER — LEVOTHYROXINE SODIUM 100 MCG IV SOLR
37.5000 ug | Freq: Every day | INTRAVENOUS | Status: DC
  Administered 2017-02-19 – 2017-02-21 (×3): 37.5 ug via INTRAVENOUS
  Filled 2017-02-19 (×4): qty 5

## 2017-02-19 MED ORDER — CEFTRIAXONE SODIUM 1 G IJ SOLR
1.0000 g | INTRAMUSCULAR | Status: DC
  Administered 2017-02-19 – 2017-02-22 (×4): 1 g via INTRAVENOUS
  Filled 2017-02-19 (×4): qty 10

## 2017-02-19 MED ORDER — LORAZEPAM 2 MG/ML IJ SOLN
0.5000 mg | Freq: Four times a day (QID) | INTRAMUSCULAR | Status: DC | PRN
  Administered 2017-02-19: 0.5 mg via INTRAVENOUS
  Filled 2017-02-19: qty 1

## 2017-02-19 MED ORDER — ACETAMINOPHEN 650 MG RE SUPP: 650.0000 mg | Freq: Four times a day (QID) | RECTAL | Status: DC | PRN

## 2017-02-19 MED ORDER — SODIUM CHLORIDE 0.9 % IV BOLUS (SEPSIS)
1000.0000 mL | Freq: Once | INTRAVENOUS | Status: AC
  Administered 2017-02-19: 1000 mL via INTRAVENOUS

## 2017-02-19 MED ORDER — ACETAMINOPHEN 325 MG PO TABS: 650.0000 mg | ORAL_TABLET | Freq: Four times a day (QID) | ORAL | Status: DC | PRN

## 2017-02-19 MED ORDER — SODIUM CHLORIDE 0.9 % IV SOLN
1000.0000 mL | INTRAVENOUS | Status: DC
  Administered 2017-02-19 – 2017-02-22 (×7): 1000 mL via INTRAVENOUS

## 2017-02-19 MED ORDER — BENZTROPINE MESYLATE 1 MG/ML IJ SOLN
1.0000 mg | Freq: Two times a day (BID) | INTRAMUSCULAR | Status: DC
  Administered 2017-02-19: 1 mg via INTRAMUSCULAR
  Filled 2017-02-19 (×3): qty 1

## 2017-02-19 MED ORDER — HALOPERIDOL LACTATE 5 MG/ML IJ SOLN
1.0000 mg | Freq: Once | INTRAMUSCULAR | Status: AC
  Administered 2017-02-19: 1 mg via INTRAVENOUS

## 2017-02-19 MED ORDER — PROMETHAZINE HCL 25 MG/ML IJ SOLN
12.5000 mg | Freq: Four times a day (QID) | INTRAMUSCULAR | Status: DC | PRN
  Administered 2017-02-19: 12.5 mg via INTRAVENOUS
  Filled 2017-02-19: qty 1

## 2017-02-19 MED ORDER — VANCOMYCIN HCL IN DEXTROSE 1-5 GM/200ML-% IV SOLN
1000.0000 mg | Freq: Once | INTRAVENOUS | Status: AC
  Administered 2017-02-19: 1000 mg via INTRAVENOUS
  Filled 2017-02-19: qty 200

## 2017-02-19 MED ORDER — HALOPERIDOL LACTATE 5 MG/ML IJ SOLN
INTRAMUSCULAR | Status: AC
  Administered 2017-02-19: 1 mg via INTRAVENOUS
  Filled 2017-02-19: qty 1

## 2017-02-19 MED ORDER — PIPERACILLIN-TAZOBACTAM 3.375 G IVPB 30 MIN
3.3750 g | Freq: Once | INTRAVENOUS | Status: AC
  Administered 2017-02-19: 3.375 g via INTRAVENOUS
  Filled 2017-02-19: qty 50

## 2017-02-19 MED ORDER — SODIUM CHLORIDE 0.9 % IV SOLN
INTRAVENOUS | Status: AC
  Administered 2017-02-19: 07:00:00 via INTRAVENOUS

## 2017-02-19 NOTE — ED Notes (Signed)
Pt returned from US

## 2017-02-19 NOTE — Progress Notes (Signed)
PROGRESS NOTE    Wesley Howard  SAY:[id number] DOB: [birth year] DOA: 02/19/2017 PCP: Raelyn Number, MD   Specialists:   Neurosurgery consulted by emergency room  Brief Narrative:  76 year old male [facility] assisted living resident Essential tremor, paranoid schizophrenia, dysphagia  Fell at ALF andunable to provide any history and was hypothermic on arrival CT brain showed 18 mm cerebellar hemorrhageand subarachnoidointment with minimal adjacent edema minimal layering and loss of height of C7 vertebral body with endplate fracture Emergency room spoke to power of attorney did not want surgical intervention-neurosurgery consulted regardless  Assessment & Plan:   Principal Problem:   SAH (subarachnoid hemorrhage) ([place]) Active Problems:   UTI (urinary tract infection)   ARF (acute renal failure) ([place])   Hypertension   Paranoid schizophrenia ([place])   Subarachnoid hemorrhage ([place])   Subarachnoid hemorrhage  Most likely secondary to fall-await neurosurgery input regarding feasibility of procedures versus plain  observation as patient's family did not want any measures suspect Will pursue measures consistent with  comfort  There is no family present today so I will speak to the daughter and [facility] POA when available to confirm what  wishes would consistent with patient's prior level of function  He is extremely combative at the bedside and refusing labs and needed to be given 1 mg of Haldol and may  need to be placed in a Posey belt if we cannot keep him safe to himself  ? Urinary tract infection Emesis  Patient had one episode of emesis in the emergency room, will give iV Phenergan 12.5  Patient is being covered currently with ceftriaxone for possible urinary infection  Monitor cultures   Paranoid schizophrenia Possible seizure disorder  As he is nothing by mouth we will change his and stroke being 1 mg, Keppra 500 twice a dayand his when necessary Ativan to IV if  possible  Hypothyroidism  Patient will be converted to IV formulation 37.5 g  Probable dementia Complication of dysphagia Hypothermia? Aspiration  Continue ceftriaxone for now-will need to discuss with family later on today intent of care once neurosurgery comes back.    Lovenox DO NOT RESUSCITATE No family present Inpatient   Consultants:   neurosurgery  Procedures:   none  Antimicrobials:   ceftriaxone    Subjective: Patient agitated kicking and screaming at bedside Not able to draw labs 1 episode of vomiting at outside with nursing I'm unable to get a meaningful exam as he attempts to punch and hit this examiner Seems to be able to move all 4 limbs without any real deficit External ocular movements seem to be more favoring the left side he does not have a pronounced facial droop   Objective: Vitals:   02/19/17 0700 02/19/17 0715 02/19/17 0745 02/19/17 0800  BP: (!) 175/92 134/69 (!) 149/110 (!) 166/87  Pulse: 70 66 99 76  Resp: 13 12 19  (!) 21  Temp:      TempSrc:      SpO2: 99% 96% 97% 95%    Intake/Output Summary (Last 24 hours) at 02/19/17 0842 Last data filed at 02/19/17 0659  Gross per 24 hour  Intake             1250 ml  Output                0 ml  Net             1250 ml   There were no vitals filed for this visit.  Examination:  Cannot examine the patient agitated and combative  Data Reviewed: I have personally reviewed following labs and imaging studies  CBC:  Recent Labs Lab 02/19/17 0433  WBC 12.2*  NEUTROABS 11.0*  HGB 13.0  HCT 37.8*  MCV 92.6  PLT 086   Basic Metabolic Panel:  Recent Labs Lab 02/19/17 0433  NA 133*  K 3.8  CL 101  CO2 25  GLUCOSE 116*  BUN 23*  CREATININE 1.74*  CALCIUM 9.1   GFR: CrCl cannot be calculated (Unknown ideal weight.). Liver Function Tests:  Recent Labs Lab 02/19/17 0433  AST 17  ALT 12*  ALKPHOS 84  BILITOT 0.9  PROT 6.9  ALBUMIN 3.8   No results for input(s):  LIPASE, AMYLASE in the last 168 hours. No results for input(s): AMMONIA in the last 168 hours. Coagulation Profile:  Recent Labs Lab 02/19/17 0610  INR 1.10   Cardiac Enzymes: No results for input(s): CKTOTAL, CKMB, CKMBINDEX, TROPONINI in the last 168 hours. BNP (last 3 results) No results for input(s): PROBNP in the last 8760 hours. HbA1C: No results for input(s): HGBA1C in the last 72 hours. CBG: No results for input(s): GLUCAP in the last 168 hours. Lipid Profile: No results for input(s): CHOL, HDL, LDLCALC, TRIG, CHOLHDL, LDLDIRECT in the last 72 hours. Thyroid Function Tests: No results for input(s): TSH, T4TOTAL, FREET4, T3FREE, THYROIDAB in the last 72 hours. Anemia Panel: No results for input(s): VITAMINB12, FOLATE, FERRITIN, TIBC, IRON, RETICCTPCT in the last 72 hours. Urine analysis:    Component Value Date/Time   COLORURINE YELLOW 02/19/2017 0417   APPEARANCEUR CLOUDY (A) 02/19/2017 0417   LABSPEC 1.012 02/19/2017 0417   PHURINE 7.0 02/19/2017 0417   GLUCOSEU NEGATIVE 02/19/2017 0417   HGBUR MODERATE (A) 02/19/2017 0417   BILIRUBINUR NEGATIVE 02/19/2017 0417   KETONESUR NEGATIVE 02/19/2017 0417   PROTEINUR 30 (A) 02/19/2017 0417   NITRITE POSITIVE (A) 02/19/2017 0417   LEUKOCYTESUR LARGE (A) 02/19/2017 0417     Radiology Studies: Reviewed images personally in health database    Scheduled Meds: . haloperidol lactate  1 mg Intravenous Once   Continuous Infusions: . sodium chloride 1,000 mL (02/19/17 0500)  . sodium chloride    . cefTRIAXone (ROCEPHIN)  IV       LOS: 0 days    Time spent: [place], MD [facility]   If 7PM-7AM, please contact night-coverage [email or web address] Password [facility] 02/19/2017, 8:42 AM

## 2017-02-19 NOTE — H&P (Signed)
TRH H&P   Patient Demographics:    Anthonee Gelin, is a 76 y.o. male  MRN: [id number]   DOB - 12/04/1940  Admit Date - 02/19/2017  Outpatient Primary MD for the patient is Raelyn Number, MD  Referring MD/NP/PA:  Dr. Leonides Schanz  Outpatient Specialists:   Patient coming from:  [place]  Chief Complaint  Patient presents with  . Altered Mental Status      HPI:    Keymani Mclean  is a 76 y.o. male, w Essential tremor, Paranoid schizophrenia, Dysphagia  Who apparently fell at ALF per ED and sent for evaluation. Pt is unable to provide any history.  Pt was noted to be hypothermic on arrival and ua showed wbc tntc.  CT brain showed => IMPRESSION: 1. Right 18 mm cerebellar hemorrhage, likely subarachnoid. Minimal adjacent edema. Minimal layering intraventricular hemorrhage in the temple of the left lateral ventricle No mass effect. 2. Increased loss of height of C7 vertebral body with fragmentation of the superior endplate, progressed from prior exam but appears chronic. No evidence of acute cervical spine fracture. Critical Value/emergent results were called by telephone at the time of interpretation on 02/19/2017 at 5:59 am to Dr. Pryor Curia , who verbally acknowledged these results.  Per Dr. Leonides Schanz she spoke with Vena Austria her POA who did not want surgical intervention for the [facility].  Dr. Leonides Schanz confirmed code status DNR, and  contacted neurosurgery PA and apparently neurosurgery will be by to evaluate the patient this am.  Pt will be admitted for [facility] and uti.     Review of systems:    In addition to the HPI above,  Pt unable to give Ros  With Past History of the following :    Past Medical History:  Diagnosis Date  . Hypertension   . Hypothyroidism   . Paranoid schizophrenia ([place])   . Seizure ([place])   . Tremor   . Urinary retention       History reviewed.  No pertinent surgical history. Past surgical history unknown   Social History:     Social History  Substance Use Topics  . Smoking status: Unknown If Ever Smoked  . Smokeless tobacco: Never Used  . Alcohol use No     Lives - at [facility] - unclear   Family History :    History reviewed. No pertinent family history. Pt unable to provide history   Home Medications:   Prior to Admission medications   Not on File     Allergies:     Allergies  Allergen Reactions  . Thioridazine Other (See Comments)    Unknown reaction      Physical Exam:   Vitals  Blood pressure (!) 148/76, pulse 62, temperature (S) (!) 96.1 F (35.6 C), temperature source Rectal, resp. rate 12, SpO2 98 %.   1. General  lying in bed in NAD,    2. Normal affect and insight, Not Suicidal or Homicidal, Awake Alert, Oriented X 1 (responds to name).  3. No F.N deficits, ALL C.Nerves Intact, Strength 5/5 all 4 extremities, Sensation intact all 4 extremities, Plantars down going.  4. Ears and Eyes appear Normal, Conjunctivae clear, PERRLA. Moist Oral Mucosa.  5. Supple Neck, No JVD, No cervical lymphadenopathy appriciated, No Carotid Bruits.  6. Symmetrical Chest wall movement, Good air movement bilaterally, CTAB.  7. RRR, No Gallops, Rubs or Murmurs, No Parasternal Heave.  8. Positive Bowel Sounds, Abdomen Soft, No tenderness, No organomegaly appriciated,No rebound -guarding or rigidity.  9.  No Cyanosis, Normal Skin Turgor, No Skin Rash or Bruise.  10. Good muscle tone,  joints appear normal , no effusions, Normal ROM.  11. No Palpable Lymph Nodes in Neck or Axillae     Data Review:    CBC  Recent Labs Lab 02/19/17 0433  WBC 12.2*  HGB 13.0  HCT 37.8*  PLT 187  MCV 92.6  MCH 31.9  MCHC 34.4  RDW 14.1  LYMPHSABS 0.5*  MONOABS 0.7  EOSABS 0.0  BASOSABS 0.0    ------------------------------------------------------------------------------------------------------------------  Chemistries   Recent Labs Lab 02/19/17 0433  NA 133*  K 3.8  CL 101  CO2 25  GLUCOSE 116*  BUN 23*  CREATININE 1.74*  CALCIUM 9.1  AST 17  ALT 12*  ALKPHOS 84  BILITOT 0.9   ------------------------------------------------------------------------------------------------------------------ CrCl cannot be calculated (Unknown ideal weight.). ------------------------------------------------------------------------------------------------------------------ No results for input(s): TSH, T4TOTAL, T3FREE, THYROIDAB in the last 72 hours.  Invalid input(s): FREET3  Coagulation profile  Recent Labs Lab 02/19/17 0610  INR 1.10   ------------------------------------------------------------------------------------------------------------------- No results for input(s): DDIMER in the last 72 hours. -------------------------------------------------------------------------------------------------------------------  Cardiac Enzymes No results for input(s): CKMB, TROPONINI, MYOGLOBIN in the last 168 hours.  Invalid input(s): CK ------------------------------------------------------------------------------------------------------------------ No results found for: BNP   ---------------------------------------------------------------------------------------------------------------  Urinalysis    Component Value Date/Time   COLORURINE YELLOW 02/19/2017 0417   APPEARANCEUR CLOUDY (A) 02/19/2017 0417   LABSPEC 1.012 02/19/2017 0417   PHURINE 7.0 02/19/2017 0417   GLUCOSEU NEGATIVE 02/19/2017 0417   HGBUR MODERATE (A) 02/19/2017 0417   BILIRUBINUR NEGATIVE 02/19/2017 0417   KETONESUR NEGATIVE 02/19/2017 0417   PROTEINUR 30 (A) 02/19/2017 0417   NITRITE POSITIVE (A) 02/19/2017 0417   LEUKOCYTESUR LARGE (A) 02/19/2017 0417      Imaging Results:    Ct Head Wo  Contrast  Result Date: 02/19/2017 CLINICAL DATA:  Unwitnessed fall today striking head. EXAM: CT HEAD WITHOUT CONTRAST CT CERVICAL SPINE WITHOUT CONTRAST TECHNIQUE: Multidetector CT imaging of the head and cervical spine was performed following the standard protocol without intravenous contrast. Multiplanar CT image reconstructions of the cervical spine were also generated. COMPARISON:  Head and cervical spine CT 06/03/2016 FINDINGS: CT HEAD FINDINGS Brain: Right posterior fossa hemorrhage measures 18 x 18 mm, this is likely in the subarachnoid space with minimal intraventricular hemorrhage layering in the posterior horn of the left lateral ventricle. Mild associated cerebellar edema. No developing hydrocephalus, basilar cisterns are patent. There is generalized atrophy and chronic small vessel ischemia. No subdural hematoma. Vascular: Atherosclerosis of skullbase vasculature without hyperdense vessel or abnormal calcification. Skull: No fracture or focal lesion. Sinuses/Orbits: Remote nasal bone fracture with leftward nasal deviation. Left cataract resection. No acute finding. Other: None. CT CERVICAL SPINE FINDINGS Alignment: Exaggerated cervical lordosis.  No traumatic listhesis. Skull base and vertebrae: Mild loss of height superior endplate of C7  with fragmentation has progressed from prior exam but does not appear acute. Remaining vertebral body heights are preserved. Sclerotic lesion known right C7 pedicle is unchanged and likely a bone island. The dens and skull base are intact. Soft tissues and spinal canal: No prevertebral fluid or swelling. No visible canal hematoma. Disc levels: Disc space narrowing and endplate spurring at J8-A4 and C5-C6. Upper chest: No acute abnormality. Other: None. IMPRESSION: 1. Right 18 mm cerebellar hemorrhage, likely subarachnoid. Minimal adjacent edema. Minimal layering intraventricular hemorrhage in the temple of the left lateral ventricle No mass effect. 2. Increased loss  of height of C7 vertebral body with fragmentation of the superior endplate, progressed from prior exam but appears chronic. No evidence of acute cervical spine fracture. Critical Value/emergent results were called by telephone at the time of interpretation on 02/19/2017 at 5:59 am to Dr. Pryor Curia , who verbally acknowledged these results. Electronically Signed   By: Jeb Levering M.D.   On: 02/19/2017 05:59   Ct Cervical Spine Wo Contrast  Result Date: 02/19/2017 CLINICAL DATA:  Unwitnessed fall today striking head. EXAM: CT HEAD WITHOUT CONTRAST CT CERVICAL SPINE WITHOUT CONTRAST TECHNIQUE: Multidetector CT imaging of the head and cervical spine was performed following the standard protocol without intravenous contrast. Multiplanar CT image reconstructions of the cervical spine were also generated. COMPARISON:  Head and cervical spine CT 06/03/2016 FINDINGS: CT HEAD FINDINGS Brain: Right posterior fossa hemorrhage measures 18 x 18 mm, this is likely in the subarachnoid space with minimal intraventricular hemorrhage layering in the posterior horn of the left lateral ventricle. Mild associated cerebellar edema. No developing hydrocephalus, basilar cisterns are patent. There is generalized atrophy and chronic small vessel ischemia. No subdural hematoma. Vascular: Atherosclerosis of skullbase vasculature without hyperdense vessel or abnormal calcification. Skull: No fracture or focal lesion. Sinuses/Orbits: Remote nasal bone fracture with leftward nasal deviation. Left cataract resection. No acute finding. Other: None. CT CERVICAL SPINE FINDINGS Alignment: Exaggerated cervical lordosis.  No traumatic listhesis. Skull base and vertebrae: Mild loss of height superior endplate of C7 with fragmentation has progressed from prior exam but does not appear acute. Remaining vertebral body heights are preserved. Sclerotic lesion known right C7 pedicle is unchanged and likely a bone island. The dens and skull base are  intact. Soft tissues and spinal canal: No prevertebral fluid or swelling. No visible canal hematoma. Disc levels: Disc space narrowing and endplate spurring at Z6-S0 and C5-C6. Upper chest: No acute abnormality. Other: None. IMPRESSION: 1. Right 18 mm cerebellar hemorrhage, likely subarachnoid. Minimal adjacent edema. Minimal layering intraventricular hemorrhage in the temple of the left lateral ventricle No mass effect. 2. Increased loss of height of C7 vertebral body with fragmentation of the superior endplate, progressed from prior exam but appears chronic. No evidence of acute cervical spine fracture. Critical Value/emergent results were called by telephone at the time of interpretation on 02/19/2017 at 5:59 am to Dr. Pryor Curia , who verbally acknowledged these results. Electronically Signed   By: Jeb Levering M.D.   On: 02/19/2017 05:59   Dg Chest Port 1 View  Result Date: 02/19/2017 CLINICAL DATA:  Unwitnessed fall. EXAM: PORTABLE CHEST 1 VIEW COMPARISON:  Chest radiograph 11/05/2016 FINDINGS: Unchanged cardiomegaly and mediastinal contours. Mild bibasilar atelectasis. No pulmonary edema, pleural effusion or pneumothorax. No confluent airspace disease. Chronic left shoulder dislocation. IMPRESSION: Bibasilar atelectasis.  Stable mild cardiomegaly. Electronically Signed   By: Jeb Levering M.D.   On: 02/19/2017 05:27   Dg Shoulder Left Portable  Result  Date: 02/19/2017 CLINICAL DATA:  Post unwitnessed fall.  Question dislocation. EXAM: LEFT SHOULDER - 1 VIEW COMPARISON:  Radiograph 11/05/2016 FINDINGS: Chronic anterior left shoulder dislocation. Alignment is unchanged from prior exam. Large Hill-Sachs impaction injury to the lateral humeral head is unchanged. Glenoid appear shallow. Acromioclavicular joint is congruent. 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 Alignment is unchanged from exam 3 months prior. Electronically Signed   By: Jeb Levering  M.D.   On: 02/19/2017 05:28      Assessment & Plan:    Active Problems:   SAH (subarachnoid hemorrhage) ([facility])   UTI (urinary tract infection)   ARF (acute renal failure) ([facility])   Hypertension   Paranoid schizophrenia ([place])    [place] Appreciate neurosurgery consult  NPO  Uti Await urine culture Rocephin 1gm iv qday  ARF Check urine sodium, urine creatinine, urine eosinophils Renal ultrasound  Hypothyroidism Check tsh Continue levothyroxine  Seizure do Continue keppra  Hypertension Cont metoprolol   Paranoid schizophrenia Cont haloperidol   DVT Prophylaxis   SCDs   AM Labs Ordered, also please review Full Orders  Family Communication: Admission, patients condition and plan of care including tests being ordered have been discussed with the patient  who indicate understanding and agree with the plan and Code Status.  Code Status DNR  Likely DC to  TBD  Condition GUARDED    Consults called:   Neurosurgery by ED  Admission status:   inpatient  Time spent in minutes : 45    Jani Gravel M.D on 02/19/2017 at 6:47 AM  Between 7am to 7pm - Pager - [phone]   After 7pm go to [email or web address] - password [facility]  [facility] - Office  [phone]

## 2017-02-19 NOTE — ED Triage Notes (Signed)
Pt here from [place] in [place], staff reports pt fell and hit head today, unwitnessed, lsn was around 930 pm, pt has not deformities or signs of trauma, strong odor of urine, staff reporting that pt is more lethargic than normal. Was hypertensive 210/112

## 2017-02-19 NOTE — ED Notes (Signed)
Pt became combative with phlebotomy, Dr. Verlon Au at bedside.  Environment adjusted, Haldol given.

## 2017-02-19 NOTE — ED Notes (Signed)
Pharmacy notified for pt 10am medications

## 2017-02-19 NOTE — ED Notes (Signed)
This tech was asked to safety sit for this pt. Pt is trying to get out of bed. Nurse is aware.

## 2017-02-19 NOTE — ED Notes (Signed)
Bair Hugger applied

## 2017-02-19 NOTE — ED Notes (Signed)
Admitting MD aware family is present. MD to talk to pt family over the phone.

## 2017-02-19 NOTE — ED Provider Notes (Signed)
TIME SEEN: 4:03 AM  CHIEF COMPLAINT: AMS  HPI: Pt is a 76 y.o. male with history of hypertension, hypothyroidism, schizophrenia, possible seizures on Keppra who presents emergency department from [facility] in [place] after an unwitnessed fall and altered mental status. I did contact Santiago Glad who is the nurse that cares for the patient at his nursing home. She reports that he is able to walk and can area conversation but is mostly wheelchair-bound. He does have some mild baseline confusion. She is not sure when he was last seen normal. They found him tonight unresponsive on the floor with his head against the wall with vomit on the floor next to him. He is not on antiplatelets or anticoagulants. His sister who is his power of attorney has been contacted. She is not aware of any recent fevers, cough, diarrhea.  He has a history of a chronically left dislocated shoulder. States that he has been sent to an outside hospital to have this reduced and this was unsuccessful.  ROS: Level V cavity on for altered mental status  PAST MEDICAL HISTORY/PAST SURGICAL HISTORY:  No past medical history on file.  MEDICATIONS:  Prior to Admission medications   Not on File    ALLERGIES:  Allergies  Allergen Reactions  . Thioridazine Other (See Comments)    Unknown reaction     SOCIAL HISTORY:  Social History  Substance Use Topics  . Smoking status: Unknown If Ever Smoked  . Smokeless tobacco: Never Used  . Alcohol use Not on file    FAMILY HISTORY: No family history on file.  EXAM: BP (!) 173/97   Pulse 77   Temp (!) 97.5 F (36.4 C)   Resp 16   SpO2 100%  CONSTITUTIONAL: Patient does open his eyes and has incomprehensible sounds when you speak to him. He is elderly, chronically ill-appearing, hypothermic HEAD: Normocephalic; atraumatic EYES: Conjunctivae clear, PERRL, keeps his eyes closed but will open them when you speak to him ENT: normal nose; no rhinorrhea; moist mucous membranes;  pharynx without lesions noted; no dental injury; no septal hematoma NECK: Supple, no meningismus, no LAD; no midline spinal tenderness, step-off or deformity; trachea midline; he keeps his head turned to the left and his neck flexed CARD: RRR; S1 and S2 appreciated; no murmurs, no clicks, no rubs, no gallops RESP: Normal chest excursion without splinting or tachypnea; breath sounds clear and equal bilaterally; no wheezes, no rhonchi, no rales; no hypoxia or respiratory distress CHEST:  chest wall stable, no crepitus or ecchymosis or deformity, nontender to palpation; no flail chest ABD/GI: Normal bowel sounds; non-distended; soft, non-tender, no rebound, no guarding; no ecchymosis or other lesions noted, indwelling Foley catheter in place PELVIS:  stable, nontender to palpation BACK:  The back appears normal and is non-tender to palpation, there is no CVA tenderness; no midline spinal tenderness, step-off or deformity EXT: Deformed appearing left shoulder without significant tenderness. No other obvious bony deformity, ecchymosis. No edema. Compartments are soft. Extremities are cool but equal pulses in all 4 extremities. SKIN: Normal color for age and race; warm NEURO: Patient will open his eyes when you speak to him and has incomprehensible sounds. He does not follow commands or answer questions. When I asked him to hold his arms and legs off the bed he does not follow these commands but if I lift his arms or legs for him he will hold them up without any drift.   MEDICAL DECISION MAKING: Patient here with altered mental status. Unclear  when he was last seen normal. Concern for possible sepsis versus stroke, head bleed. Unable to call a code sepsis at this time given unknown last seen normal and with significant deficits I feel he is too high risk for TPA. We'll obtain labs, cultures, urine, chest x-ray, CT of the head and cervical spine. We'll get in touch with patient's sister.  ED PROGRESS:  Patient's labs show mild leukocytosis with left shift. He also has a mildly elevated creatinine with no old for comparison. Troponin negative. Lactate normal. He does appear to have a urinary tract infection. Will give broad-spectrum antibiotics given his hypothermia, leukocytosis with a source of infection. He is receiving IV fluids. He has not been hypotensive.   CT of his head shows a right posterior fossa hemorrhage at his approximate 18 x 18 mm is likely subarachnoid in nature with intraventricular spread into the left lateral ventricle. There is mild associated cerebellar edema. No skull fracture. He does have increased loss of the height of the C7 vertebral body that appears to be chronic. No acute cervical spine fracture noted. We'll discuss with neurosurgery on call.  6:00 AM  D/w pt's sister Vena Austria who is also patient's power of attorney. Her numbers [phone].  She states patient would want to be a DNR/DNI.  She states that she does not think that he would want any intervention for this head bleed. She would want patient to be admitted to the hospital and continue IV fluids, antibiotics. We have discussed patient's poor prognosis given he has not had any improvement in his mental status. I have advised her to come to the emergency department.  6:20 AM  D/w PA Joelene Millin on call for NSG.  They will see patient in consult this AM.  6:32 AM Discussed patient's case with hospitalist, Dr. Maudie Mercury.  I have recommended admission and patient (and family if present) agree with this plan. Admitting physician will place admission orders.   I reviewed all nursing notes, vitals, pertinent previous records, EKGs, lab and urine results, imaging (as available).     EKG Interpretation  Date/Time:  Friday February 19 2017 03:55:11 EDT Ventricular Rate:  76 PR Interval:    QRS Duration: 103 QT Interval:  432 QTC Calculation: 486 R Axis:   -7 Text Interpretation:  Sinus rhythm Left ventricular  hypertrophy Borderline prolonged QT interval No old tracing to compare Confirmed by Anab Vivar, Cyril Mourning [phone]) on 02/19/2017 4:03:09 AM        CRITICAL CARE Performed by: Nyra Jabs   Total critical care time: 60 minutes  Critical care time was exclusive of separately billable procedures and treating other patients.  Critical care was necessary to treat or prevent imminent or life-threatening deterioration.  Critical care was time spent personally by me on the following activities: development of treatment plan with patient and/or surrogate as well as nursing, discussions with consultants, evaluation of patient's response to treatment, examination of patient, obtaining history from patient or surrogate, ordering and performing treatments and interventions, ordering and review of laboratory studies, ordering and review of radiographic studies, pulse oximetry and re-evaluation of patient's condition.    Denym Rahimi, Delice Bison, DO 02/19/17 [phone]

## 2017-02-20 DIAGNOSIS — Z515 Encounter for palliative care: Secondary | ICD-10-CM

## 2017-02-20 LAB — CALCIUM / CREATININE RATIO, URINE
CALCIUM/CREAT. RATIO: 101 mg/g{creat} (ref 0–260)
CREATININE, UR: 58.4 mg/dL
Calcium, Ur: 5.9 mg/dL

## 2017-02-20 LAB — URINE CULTURE

## 2017-02-20 MED ORDER — BENZTROPINE MESYLATE 1 MG/ML IJ SOLN: 1.0000 mg | Freq: Two times a day (BID) | INTRAMUSCULAR | Status: DC

## 2017-02-20 MED ORDER — BENZTROPINE MESYLATE 1 MG/ML IJ SOLN
1.0000 mg | Freq: Two times a day (BID) | INTRAMUSCULAR | Status: DC
  Administered 2017-02-20 – 2017-02-22 (×6): 1 mg via INTRAVENOUS
  Filled 2017-02-20 (×6): qty 1

## 2017-02-20 MED ORDER — HYDRALAZINE HCL 20 MG/ML IJ SOLN
10.0000 mg | INTRAMUSCULAR | Status: DC | PRN
  Administered 2017-02-20 – 2017-02-22 (×3): 10 mg via INTRAVENOUS
  Filled 2017-02-20 (×3): qty 1

## 2017-02-20 NOTE — Progress Notes (Signed)
PRN Hydralazine order received for SBP>150. Will re-assess BP and continue to monitor.

## 2017-02-20 NOTE — Progress Notes (Signed)
PROGRESS NOTE    Wesley Howard  IRC:[id number] DOB: 1940-10-17 DOA: 02/19/2017 PCP: Raelyn Number, MD   Brief Narrative: 76 y.o. male, w Essential tremor, Paranoid schizophrenia, Dysphagia  Who apparently fell at ALF per ED and sent for evaluation. Pt is unable to provide any history.  Pt was noted to be hypothermic on arrival and ua showed wbc tntc.CT scan of the brain showed 18 mm cerebellar hemorrhage with minimal adjacent edema Patient is being treated nonsurgically. When I first saw him today he was restg sleeping later he woke up worked with therapy.   Assessment & Plan:   Principal Problem:   SAH (subarachnoid hemorrhage) (HCC) Active Problems:   UTI (urinary tract infection)   ARF (acute renal failure) (HCC)   Hypertension   Paranoid schizophrenia ([place])   Subarachnoid hemorrhage ([place])   Palliative care by specialist  Subarachnoid hemorrhage treated nonsurgically. hypothyroidism Seizure disorder schizophrenia  Plan would be to observe him today and discharge him back to living. Patient is DO NOT RESUSCITATE appreciative palliative care input. DVT prophylaxis: scd Code Statusdnr Family Communication: none Disposition Plan:  Procedures:  Antimicrobials:    Subjective:   Objective: Vitals:   02/20/17 0500 02/20/17 0545 02/20/17 1322 02/20/17 1400  BP:  (!) 154/93 (!) 168/80   Pulse:   (!) 107   Resp:  16 15 12   Temp:   (!) 97.5 F (36.4 C)   TempSrc:   Oral   SpO2:  93% 97% 97%  Weight: 79 kg (174 lb 3.2 oz)     Height: 5\' 6"  (1.676 m)       Intake/Output Summary (Last 24 hours) at 02/20/17 1532 Last data filed at 02/20/17 1223  Gross per 24 hour  Intake             2555 ml  Output             3075 ml  Net             -520 ml   Filed Weights   02/19/17 2236 02/20/17 0500  Weight: 79.8 kg (175 lb 14.4 oz) 79 kg (174 lb 3.2 oz)    Examination:  General exam: Appears calm and comfortable  Respiratory system: Clear to auscultation. Respiratory  effort normal. Cardiovascular system: S1 & S2 heard, RRR. No JVD, murmurs, rubs, gallops or clicks. No pedal edema. Gastrointestinal system: Abdomen is nondistended, soft and nontender. No organomegaly or masses felt. Normal bowel sounds heard. Central nervous system: Alert and oriented. No focal neurological deficits. Extremities: Symmetric 5 x 5 power. Skin: No rashes, lesions or ulcers Psychiatry: Judgement and insight appear normal. Mood & affect appropriate.     Data Reviewed: I have personally reviewed following labs and imaging studies  CBC:  Recent Labs Lab 02/19/17 0433  WBC 12.2*  NEUTROABS 11.0*  HGB 13.0  HCT 37.8*  MCV 92.6  PLT 510   Basic Metabolic Panel:  Recent Labs Lab 02/19/17 0433  NA 133*  K 3.8  CL 101  CO2 25  GLUCOSE 116*  BUN 23*  CREATININE 1.74*  CALCIUM 9.1   GFR: Estimated Creatinine Clearance: 36.8 mL/min (A) (by C-G formula based on SCr of 1.74 mg/dL (H)). Liver Function Tests:  Recent Labs Lab 02/19/17 0433  AST 17  ALT 12*  ALKPHOS 84  BILITOT 0.9  PROT 6.9  ALBUMIN 3.8   No results for input(s): LIPASE, AMYLASE in the last 168 hours. No results for input(s): AMMONIA in the last  168 hours. Coagulation Profile:  Recent Labs Lab 02/19/17 0610  INR 1.10   Cardiac Enzymes: No results for input(s): CKTOTAL, CKMB, CKMBINDEX, TROPONINI in the last 168 hours. BNP (last 3 results) No results for input(s): PROBNP in the last 8760 hours. HbA1C: No results for input(s): HGBA1C in the last 72 hours. CBG: No results for input(s): GLUCAP in the last 168 hours. Lipid Profile: No results for input(s): CHOL, HDL, LDLCALC, TRIG, CHOLHDL, LDLDIRECT in the last 72 hours. Thyroid Function Tests: No results for input(s): TSH, T4TOTAL, FREET4, T3FREE, THYROIDAB in the last 72 hours. Anemia Panel: No results for input(s): VITAMINB12, FOLATE, FERRITIN, TIBC, IRON, RETICCTPCT in the last 72 hours. Sepsis Labs:  Recent Labs Lab  02/19/17 0458  LATICACIDVEN 1.53    Recent Results (from the past 240 hour(s))  Urine culture     Status: Abnormal   Collection Time: 02/19/17  4:18 AM  Result Value Ref Range Status   Specimen Description URINE, RANDOM  Final   Special Requests NONE  Final   Culture MULTIPLE SPECIES PRESENT, SUGGEST RECOLLECTION (A)  Final   Report Status 02/20/2017 FINAL  Final  Blood Culture (routine x 2)     Status: None (Preliminary result)   Collection Time: 02/19/17  4:55 AM  Result Value Ref Range Status   Specimen Description BLOOD LEFT FOREARM  Final   Special Requests   Final    BOTTLES DRAWN AEROBIC AND ANAEROBIC Blood Culture adequate volume   Culture NO GROWTH 1 DAY  Final   Report Status PENDING  Incomplete  Blood Culture (routine x 2)     Status: None (Preliminary result)   Collection Time: 02/19/17  4:57 AM  Result Value Ref Range Status   Specimen Description BLOOD RIGHT ANTECUBITAL  Final   Special Requests   Final    BOTTLES DRAWN AEROBIC AND ANAEROBIC Blood Culture adequate volume   Culture NO GROWTH 1 DAY  Final   Report Status PENDING  Incomplete         Radiology Studies: Ct Head Wo Contrast  Result Date: 02/19/2017 CLINICAL DATA:  Unwitnessed fall today striking head. EXAM: CT HEAD WITHOUT CONTRAST CT CERVICAL SPINE WITHOUT CONTRAST TECHNIQUE: Multidetector CT imaging of the head and cervical spine was performed following the standard protocol without intravenous contrast. Multiplanar CT image reconstructions of the cervical spine were also generated. COMPARISON:  Head and cervical spine CT 06/03/2016 FINDINGS: CT HEAD FINDINGS Brain: Right posterior fossa hemorrhage measures 18 x 18 mm, this is likely in the subarachnoid space with minimal intraventricular hemorrhage layering in the posterior horn of the left lateral ventricle. Mild associated cerebellar edema. No developing hydrocephalus, basilar cisterns are patent. There is generalized atrophy and chronic small  vessel ischemia. No subdural hematoma. Vascular: Atherosclerosis of skullbase vasculature without hyperdense vessel or abnormal calcification. Skull: No fracture or focal lesion. Sinuses/Orbits: Remote nasal bone fracture with leftward nasal deviation. Left cataract resection. No acute finding. Other: None. CT CERVICAL SPINE FINDINGS Alignment: Exaggerated cervical lordosis.  No traumatic listhesis. Skull base and vertebrae: Mild loss of height superior endplate of C7 with fragmentation has progressed from prior exam but does not appear acute. Remaining vertebral body heights are preserved. Sclerotic lesion known right C7 pedicle is unchanged and likely a bone island. The dens and skull base are intact. Soft tissues and spinal canal: No prevertebral fluid or swelling. No visible canal hematoma. Disc levels: Disc space narrowing and endplate spurring at V3-X1 and C5-C6. Upper chest: No acute abnormality.  Other: None. IMPRESSION: 1. Right 18 mm cerebellar hemorrhage, likely subarachnoid. Minimal adjacent edema. Minimal layering intraventricular hemorrhage in the temple of the left lateral ventricle No mass effect. 2. Increased loss of height of C7 vertebral body with fragmentation of the superior endplate, progressed from prior exam but appears chronic. No evidence of acute cervical spine fracture. Critical Value/emergent results were called by telephone at the time of interpretation on 02/19/2017 at 5:59 am to Dr. Pryor Curia , who verbally acknowledged these results. Electronically Signed   By: Jeb Levering M.D.   On: 02/19/2017 05:59   Ct Cervical Spine Wo Contrast  Result Date: 02/19/2017 CLINICAL DATA:  Unwitnessed fall today striking head. EXAM: CT HEAD WITHOUT CONTRAST CT CERVICAL SPINE WITHOUT CONTRAST TECHNIQUE: Multidetector CT imaging of the head and cervical spine was performed following the standard protocol without intravenous contrast. Multiplanar CT image reconstructions of the cervical spine  were also generated. COMPARISON:  Head and cervical spine CT 06/03/2016 FINDINGS: CT HEAD FINDINGS Brain: Right posterior fossa hemorrhage measures 18 x 18 mm, this is likely in the subarachnoid space with minimal intraventricular hemorrhage layering in the posterior horn of the left lateral ventricle. Mild associated cerebellar edema. No developing hydrocephalus, basilar cisterns are patent. There is generalized atrophy and chronic small vessel ischemia. No subdural hematoma. Vascular: Atherosclerosis of skullbase vasculature without hyperdense vessel or abnormal calcification. Skull: No fracture or focal lesion. Sinuses/Orbits: Remote nasal bone fracture with leftward nasal deviation. Left cataract resection. No acute finding. Other: None. CT CERVICAL SPINE FINDINGS Alignment: Exaggerated cervical lordosis.  No traumatic listhesis. Skull base and vertebrae: Mild loss of height superior endplate of C7 with fragmentation has progressed from prior exam but does not appear acute. Remaining vertebral body heights are preserved. Sclerotic lesion known right C7 pedicle is unchanged and likely a bone island. The dens and skull base are intact. Soft tissues and spinal canal: No prevertebral fluid or swelling. No visible canal hematoma. Disc levels: Disc space narrowing and endplate spurring at B3-Z3 and C5-C6. Upper chest: No acute abnormality. Other: None. IMPRESSION: 1. Right 18 mm cerebellar hemorrhage, likely subarachnoid. Minimal adjacent edema. Minimal layering intraventricular hemorrhage in the temple of the left lateral ventricle No mass effect. 2. Increased loss of height of C7 vertebral body with fragmentation of the superior endplate, progressed from prior exam but appears chronic. No evidence of acute cervical spine fracture. Critical Value/emergent results were called by telephone at the time of interpretation on 02/19/2017 at 5:59 am to Dr. Pryor Curia , who verbally acknowledged these results. Electronically  Signed   By: Jeb Levering M.D.   On: 02/19/2017 05:59   US Renal  Result Date: 02/19/2017 CLINICAL DATA:  Acute renal failure EXAM: RENAL / URINARY TRACT ULTRASOUND COMPLETE COMPARISON:  CT scan 01/28/2017 FINDINGS: Right Kidney: Length: 11.2 cm. Cortical thinning with increased echogenicity of the renal parenchyma. No hydronephrosis. Left Kidney: Length: 10.2 cm. Cortical thinning with increased echogenicity. No hydronephrosis. 3.8 cm hypoechoic lesion identified towards the upper pole not well seen on recent noncontrast CT. Bladder: Appears normal for degree of bladder distention. IMPRESSION: 1. No evidence for hydronephrosis. 2. Echogenic kidneys bilaterally suggesting medical renal disease. 3. 3.8 cm hypoechoic left upper pole renal lesion not well seen on the recent noncontrast CT exam. Although patient renal function may preclude contrast, MRI may provide some additional insight. Alternatively, follow-up ultrasound could be used to ensure stability of this lesion. Electronically Signed   By: Misty Stanley M.D.   On:  02/19/2017 15:17   Dg Chest Port 1 View  Result Date: 02/19/2017 CLINICAL DATA:  Unwitnessed fall. EXAM: PORTABLE CHEST 1 VIEW COMPARISON:  Chest radiograph 11/05/2016 FINDINGS: Unchanged cardiomegaly and mediastinal contours. Mild bibasilar atelectasis. No pulmonary edema, pleural effusion or pneumothorax. No confluent airspace disease. Chronic left shoulder dislocation. IMPRESSION: Bibasilar atelectasis.  Stable mild cardiomegaly. Electronically Signed   By: Jeb Levering M.D.   On: 02/19/2017 05:27   Dg Shoulder Left Portable  Result Date: 02/19/2017 CLINICAL DATA:  Post unwitnessed fall.  Question dislocation. EXAM: LEFT SHOULDER - 1 VIEW COMPARISON:  Radiograph 11/05/2016 FINDINGS: Chronic anterior left shoulder dislocation. Alignment is unchanged from prior exam. Large Hill-Sachs impaction injury to the lateral humeral head is unchanged. Glenoid appear shallow.  Acromioclavicular joint is congruent. 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 Alignment is unchanged from exam 3 months prior. Electronically Signed   By: Jeb Levering M.D.   On: 02/19/2017 05:28        Scheduled Meds: . benztropine mesylate  1 mg Intravenous BID  . levothyroxine  37.5 mcg Intravenous Daily   Continuous Infusions: . sodium chloride 1,000 mL (02/20/17 0800)  . cefTRIAXone (ROCEPHIN)  IV Stopped (02/20/17 0935)  . levETIRAcetam Stopped (02/20/17 1036)     LOS: 1 day     Georgette Shell, MD [facility]  If 7PM-7AM, please contact night-coverage [email or web address] Password [facility] 02/20/2017, 3:32 PM

## 2017-02-20 NOTE — Consult Note (Signed)
Consultation Note Date: 02/20/2017   Patient Name: Wesley Howard  DOB: 23-May-1941  MRN: [id number]  Age / Sex: 76 y.o., male  PCP: Raelyn Number, MD Referring Physician: Georgette Shell, MD  Reason for Consultation: Establishing goals of care and Psychosocial/spiritual support  HPI/Patient Profile: 76 y.o. male  with past medical history of Essential tremor, paranoid schizophrenia, dysphagia admitted on 02/19/2017 assisted living facility after he fell. CT of the brain showed right 18 mm cerebellar hemorrhage likely subarachnoid. Minimal adjacent edema, minimal layering intraventricular hemorrhage in the temple of the left lateral ventricle. No mass effect. CT of the spine shows increased loss of height of C7 vertebral body with fragmentation of the superior endplate but no acute cervical spine fracture. He also shows chronic left shoulder dislocation. He was also found to have a urinary tract infection  Per chart review patient's sister, Vena Austria, was notified upon his admission and she confirmed DO NOT RESUSCITATE as well as declined any further neurosurgical workup.   Consult to palliative medicine secondary to subarachnoid bleed, goals of care Clinical Assessment and Goals of Care: Patient is alert and oriented to himself, seems confused as to where he is. He does not remember falling; he is denying a headache. He is currently working with speech therapy for evaluation of his swallow secondary to reported history of dysphagia. He is cooperative, calm, and outwardly appears to be swallowing with no overt signs of aspiration  Wesley Howard, [phone], is his healthcare proxy  She states her brother always lived with her other brother up until 2 years ago when patient's brother died of a heart attack. At that point he went to live at [facility] assisted living in the interim he had fallen and broken  his ankle and was in skilled level of care and then eventually transferred back to [place] where she states overall he has done well. At baseline, he is ambulatory but will often sit in a wheelchair; is able to feed himself and dress himself    SUMMARY OF RECOMMENDATIONS   DNR/DNI. Confirmed by ED physician Back to [facility] assisted living facility when medically stable per sister Vena Austria Recommend palliative medicine services in the community follow patient in the facility. Their office is a division of Hospice and Palliative medicine in [place], [phone], palliative medicine direct line Code Status/Advance Care Planning:  DNR    Symptom Management:   Agitation: Prior to admission patient was on Haldol. See that he has received 1 mg but is not on scheduled antipsychotics. Patient is currently calm clock her to cooperative, no further overt signs and symptoms of psychosis or paranoia. Continue Cogentin 1 mg twice daily and Ativan 0.5 every 6 hours as needed. Recommend resuming Haldol when safe to do so (neuroleptics alter seizure threshold).   Seizures: Continue with Keppra  Nausea: No nausea and vomiting noted since admission. Continue with Phenergan 12.5 mg every 6 hours as needed.  Palliative Prophylaxis:   Aspiration, Bowel Regimen, Delirium Protocol and Frequent Pain Assessment  Additional Recommendations (Limitations, Scope, Preferences):  Minimize Medications, Initiate Comfort Feeding, No Artificial Feeding, No Chemotherapy, No Hemodialysis, No Radiation, No Surgical Procedures and No Tracheostomy  Psycho-social/Spiritual:   Desire for further Chaplaincy support:no  Additional Recommendations: Referral to Community Resources   Prognosis:   Unable to determine  Discharge Planning: [place] for rehab with Palliative care service follow-up      Primary Diagnoses: Present on Admission: **None**   I have reviewed the medical record,  interviewed the patient and family, and examined the patient. The following aspects are pertinent.  Past Medical History:  Diagnosis Date  . Hypertension   . Hypothyroidism   . Paranoid schizophrenia ([place])   . Seizure ([place])   . Tremor   . Urinary retention    Social History   Social History  . Marital status: Widowed    Spouse name: N/A  . Number of children: N/A  . Years of education: N/A   Social History Main Topics  . Smoking status: Unknown If Ever Smoked  . Smokeless tobacco: Never Used  . Alcohol use No  . Drug use: Unknown  . Sexual activity: Not Asked   Other Topics Concern  . None   Social History Narrative  . None   History reviewed. No pertinent family history. Scheduled Meds: . benztropine mesylate  1 mg Intravenous BID  . levothyroxine  37.5 mcg Intravenous Daily   Continuous Infusions: . sodium chloride 1,000 mL (02/19/17 2345)  . cefTRIAXone (ROCEPHIN)  IV 1 g (02/20/17 0904)  . levETIRAcetam Stopped (02/20/17 0155)   PRN Meds:.LORazepam, promethazine Medications Prior to Admission:  Prior to Admission medications   Medication Sig Start Date End Date Taking? Authorizing Provider  aspirin 81 MG chewable tablet Chew 81 mg by mouth daily.   Yes [provider]  benztropine (COGENTIN) 1 MG tablet Take 1 mg by mouth 2 (two) times daily.   Yes [provider]  haloperidol (HALDOL) 2 MG tablet Take 2 mg by mouth 2 (two) times daily.   Yes [provider]  levETIRAcetam (KEPPRA) 500 MG tablet Take 500 mg by mouth 2 (two) times daily.   Yes [provider]  levothyroxine (SYNTHROID, LEVOTHROID) 75 MCG tablet Take 75 mcg by mouth daily before breakfast.   Yes [provider]  LORazepam (ATIVAN) 0.5 MG tablet Take 0.5 mg by mouth 2 (two) times daily.   Yes [provider]  metoprolol tartrate (LOPRESSOR) 50 MG tablet Take 50 mg by mouth 2 (two) times daily.   Yes [provider]  potassium chloride  SA (K-DUR,KLOR-CON) 20 MEQ tablet Take 20 mEq by mouth 2 (two) times daily.   Yes [provider]  ranitidine (ZANTAC) 150 MG tablet Take 150 mg by mouth 2 (two) times daily.   Yes [provider]  trimethoprim (TRIMPEX) 100 MG tablet Take 200 mg by mouth at bedtime.   Yes [provider]   Allergies  Allergen Reactions  . Thioridazine Other (See Comments)    Unknown reaction    Review of Systems  Unable to perform ROS: Mental status change    Physical Exam  Constitutional: He appears well-developed and well-nourished.  HENT:  Head: Normocephalic and atraumatic.  Neck: Normal range of motion.  Cardiovascular:  PVC's  Pulmonary/Chest: Effort normal.  Musculoskeletal: Normal range of motion.  Neurological: He is alert.  Oriented to self Disoriented to place, asking "put me here" Speech somewhat difficult to understand  Skin: Skin is warm and dry.  There is pallor.  Psychiatric:  Patient working with speech therapy. Eating applesauce and graham crackers without any difficulty. Cooperative, calm  Nursing note and vitals reviewed.   Vital Signs: BP (!) 154/93 (BP Location: Right Arm)   Pulse 83   Temp 98.3 F (36.8 C) (Axillary)   Resp 16   Ht 5\' 6"  (1.676 m) Comment: Estimation  Wt 79 kg (174 lb 3.2 oz)   SpO2 93%   BMI 28.12 kg/m  Pain Assessment: No/denies pain   Pain Score: 0-No pain   SpO2: SpO2: 93 % O2 Device:SpO2: 93 % O2 Flow Rate: .   IO: Intake/output summary:  Intake/Output Summary (Last 24 hours) at 02/20/17 0916 Last data filed at 02/20/17 0700  Gross per 24 hour  Intake             2365 ml  Output             2675 ml  Net             -310 ml    LBM:   Baseline Weight: Weight: 79.8 kg (175 lb 14.4 oz) Most recent weight: Weight: 79 kg (174 lb 3.2 oz)     Palliative Assessment/Data:   Flowsheet Rows     Most Recent Value  Intake Tab  Referral Department  Hospitalist  Unit at Time of Referral  Med/Surg Unit    Palliative Care Primary Diagnosis  Neurology  Date Notified  02/19/17  Palliative Care Type  New Palliative care  Reason for referral  Clarify Goals of Care, Psychosocial or Spiritual support  Clinical Assessment  Palliative Performance Scale Score  40%  Pain Max last 24 hours  Not able to report  Pain Min Last 24 hours  Not able to report  Dyspnea Max Last 24 Hours  Not able to report  Dyspnea Min Last 24 hours  Not able to report  Nausea Max Last 24 Hours  Not able to report  Nausea Min Last 24 Hours  Not able to report  Anxiety Max Last 24 Hours  Not able to report  Anxiety Min Last 24 Hours  Not able to report  Other Max Last 24 Hours  Not able to report  Psychosocial & Spiritual Assessment  Palliative Care Outcomes  Palliative Care Outcomes  Clarified goals of care  Patient/Family wishes: Interventions discontinued/not started   Mechanical Ventilation, Trach      Time In: 0800 Time Out: 0910 Time Total: 70 min Staffed with Dr. Zigmund Daniel  Signed by: Dory Horn, NP   Please contact Palliative Medicine Team phone at [phone] for questions and concerns.  For individual provider: See Shea Evans

## 2017-02-20 NOTE — Progress Notes (Signed)
Dr. Zigmund Daniel notified of elevated BP's this afternoon with current-170/91. Will continue to monitor.

## 2017-02-20 NOTE — Evaluation (Signed)
Clinical/Bedside Swallow Evaluation Patient Details  Name: Wesley Howard MRN: [id number] Date of Birth: January 12, 1941  Today's Date: 02/20/2017 Time: SLP Start Time (ACUTE ONLY): 0840 SLP Stop Time (ACUTE ONLY): 0910 SLP Time Calculation (min) (ACUTE ONLY): 30 min  Past Medical History:  Past Medical History:  Diagnosis Date  . Hypertension   . Hypothyroidism   . Paranoid schizophrenia ([place])   . Seizure ([place])   . Tremor   . Urinary retention    Past Surgical History: History reviewed. No pertinent surgical history. HPI:  Pt is a 76 y.o. male with PMH of Essential tremor, Paranoid schizophrenia, Dysphagia Who apparently fell at ALF per ED and sent for evaluation. Pt is unable to provide any history. Pt was noted to be hypothermic on arrival and ua showed wbc tntc. Head CT showed R 18 mm cerebellar hemorrhage, likely subarachnoid, minimal adjacent edema, minimal layering intraventricular hemorrhage in the temple of the left lateral ventricle. CXR showed bibasilar atelectasis. As of now, comfort care measures are being pursued vs neurosurgery. No information in chart regarding pt's previous diet although dysphagia is mentioned. Bedside swallow eval ordered.    Assessment / Plan / Recommendation Clinical Impression  Pt had an intermittent, congested cough during evaluation which also occurred before having any PO intake; it increased in frequency after trials of regular solid but was never immediately after swallow. Pt was pleasant but confused during evaluation, oriented to self but disoriented to time/ location/ situation. Speech was significantly dysarthric. There is a hx of dysphagia noted in chart but no information regarding baseline diet consistency and family was not at bedside to discuss. Recommend initiating dysphagia 3 diet/ thin liquids, meds whole with liquid, full supervision during meal as pt needs some assistance with feeding/ cues for small bites/ sips. Will continue to follow for  diet tolerance/ consider need for objective evaluation.  SLP Visit Diagnosis: Dysphagia, unspecified (R13.10)    Aspiration Risk  Mild aspiration risk;Moderate aspiration risk    Diet Recommendation Dysphagia 3 (Mech soft);Thin liquid   Liquid Administration via: Cup;Straw Medication Administration: Whole meds with liquid Supervision: Patient able to self feed;Full supervision/cueing for compensatory strategies Compensations: Minimize environmental distractions;Slow rate;Small sips/bites Postural Changes: Seated upright at 90 degrees    Other  Recommendations Oral Care Recommendations: Oral care BID   Follow up Recommendations Skilled Nursing facility      Frequency and Duration min 1 x/week  1 week       Prognosis Prognosis for Safe Diet Advancement: Fair Barriers to Reach Goals: Cognitive deficits      Swallow Study   General HPI: Pt is a 76 y.o. male with PMH of Essential tremor, Paranoid schizophrenia, Dysphagia Who apparently fell at ALF per ED and sent for evaluation. Pt is unable to provide any history. Pt was noted to be hypothermic on arrival and ua showed wbc tntc. Head CT showed R 18 mm cerebellar hemorrhage, likely subarachnoid, minimal adjacent edema, minimal layering intraventricular hemorrhage in the temple of the left lateral ventricle. CXR showed bibasilar atelectasis. As of now, comfort care measures are being pursued vs neurosurgery. No information in chart regarding pt's previous diet although dysphagia is mentioned. Bedside swallow eval ordered.  Type of Study: Bedside Swallow Evaluation Previous Swallow Assessment: none in chart but hx of dysphagia noted Diet Prior to this Study: NPO Temperature Spikes Noted: No History of Recent Intubation: No Behavior/Cognition: Alert;Cooperative;Pleasant mood;Confused Oral Cavity Assessment: Within Functional Limits Oral Cavity - Dentition: Poor condition;Missing dentition Vision: Functional for  self-feeding Self-Feeding Abilities: Able to feed self;Needs assist Patient Positioning: Upright in bed Baseline Vocal Quality: Normal Volitional Cough: Strong;Congested Volitional Swallow: Able to elicit    Oral/Motor/Sensory Function Overall Oral Motor/Sensory Function: Within functional limits   Ice Chips Ice chips: Not tested   Thin Liquid Thin Liquid: Within functional limits Presentation: Cup;Self Fed    Nectar Thick Nectar Thick Liquid: Not tested   Honey Thick Honey Thick Liquid: Not tested   Puree Puree: Within functional limits   Solid   GO   Solid: Impaired Presentation: Self Fed Pharyngeal Phase Impairments: Cough - Delayed        Wesley Dionicia Abler, MA, CCC-SLP 02/20/2017,9:16 AM

## 2017-02-21 MED ORDER — LEVETIRACETAM 500 MG PO TABS
500.0000 mg | ORAL_TABLET | Freq: Two times a day (BID) | ORAL | Status: DC
  Administered 2017-02-21 – 2017-02-22 (×2): 500 mg via ORAL
  Filled 2017-02-21 (×2): qty 1

## 2017-02-21 MED ORDER — LEVOTHYROXINE SODIUM 75 MCG PO TABS
75.0000 ug | ORAL_TABLET | Freq: Every day | ORAL | Status: DC
Start: 2017-02-22 — End: 2017-02-22
  Administered 2017-02-22: 75 ug via ORAL
  Filled 2017-02-21: qty 1

## 2017-02-21 NOTE — Progress Notes (Signed)
PROGRESS NOTE    Wesley Howard  JME:[id number] DOB: 06-Dec-1940 DOA: 02/19/2017 PCP: Raelyn Number, MD   Brief Narrative: 76 y.o.male,w Essential tremor, Paranoid schizophrenia, Dysphagia Who apparently fell at ALF per ED and sent for evaluation. Pt is unable to provide any history. Pt was noted to be hypothermic on arrival and ua showed wbc tntc.CT scan of the brain showed 18 mm cerebellar hemorrhage with minimal adjacent edema Patient is being treated nonsurgically.   Assessment & Plan:   Principal Problem:   SAH (subarachnoid hemorrhage) (HCC) Active Problems:   UTI (urinary tract infection)   ARF (acute renal failure) (HCC)   Hypertension   Paranoid schizophrenia ([place])   Subarachnoid hemorrhage ([place])   Palliative care by specialist  [facility] Patient needsplacement.[place]he needs more care now.needs assistance with adls.CM consult for placemnt.    DVT prophylaxis: scd Code Status: dnr Family Communication:  Disposition Plan:    Consultants:  Procedures:  none Antimicrobials: none   Subjective:  Objective: Vitals:   02/21/17 0430 02/21/17 0809 02/21/17 1000 02/21/17 1200  BP: (!) 174/84 (!) 161/89 (!) 154/87 (!) 160/96  Pulse: 85 77  82  Resp: 20 13 18 13   Temp: 97.8 F (36.6 C) 98 F (36.7 C)  98.2 F (36.8 C)  TempSrc: Oral Oral  Oral  SpO2: 97% 96% 100% 97%  Weight: 79.7 kg (175 lb 9.6 oz)     Height:        Intake/Output Summary (Last 24 hours) at 02/21/17 1536 Last data filed at 02/21/17 1532  Gross per 24 hour  Intake             4620 ml  Output             3000 ml  Net             1620 ml   Filed Weights   02/19/17 2236 02/20/17 0500 02/21/17 0430  Weight: 79.8 kg (175 lb 14.4 oz) 79 kg (174 lb 3.2 oz) 79.7 kg (175 lb 9.6 oz)    Examination:  General exam: Appears calm and comfortable  Respiratory system: Clear to auscultation. Respiratory effort normal. Cardiovascular system: S1 & S2 heard, RRR. No JVD, murmurs, rubs, gallops or  clicks. No pedal edema. Gastrointestinal system: Abdomen is nondistended, soft and nontender. No organomegaly or masses felt. Normal bowel sounds heard. Central nervous system: Alert and oriented. No focal neurological deficits. Extremities: Symmetric 5 x 5 power. Skin: No rashes, lesions or ulcers Psychiatry: Judgement and insight appear normal. Mood & affect appropriate.     Data Reviewed: I have personally reviewed following labs and imaging studies  CBC:  Recent Labs Lab 02/19/17 0433  WBC 12.2*  NEUTROABS 11.0*  HGB 13.0  HCT 37.8*  MCV 92.6  PLT 229   Basic Metabolic Panel:  Recent Labs Lab 02/19/17 0433  NA 133*  K 3.8  CL 101  CO2 25  GLUCOSE 116*  BUN 23*  CREATININE 1.74*  CALCIUM 9.1   GFR: Estimated Creatinine Clearance: 37 mL/min (A) (by C-G formula based on SCr of 1.74 mg/dL (H)). Liver Function Tests:  Recent Labs Lab 02/19/17 0433  AST 17  ALT 12*  ALKPHOS 84  BILITOT 0.9  PROT 6.9  ALBUMIN 3.8   No results for input(s): LIPASE, AMYLASE in the last 168 hours. No results for input(s): AMMONIA in the last 168 hours. Coagulation Profile:  Recent Labs Lab 02/19/17 0610  INR 1.10   Cardiac Enzymes:  No results for input(s): CKTOTAL, CKMB, CKMBINDEX, TROPONINI in the last 168 hours. BNP (last 3 results) No results for input(s): PROBNP in the last 8760 hours. HbA1C: No results for input(s): HGBA1C in the last 72 hours. CBG: No results for input(s): GLUCAP in the last 168 hours. Lipid Profile: No results for input(s): CHOL, HDL, LDLCALC, TRIG, CHOLHDL, LDLDIRECT in the last 72 hours. Thyroid Function Tests: No results for input(s): TSH, T4TOTAL, FREET4, T3FREE, THYROIDAB in the last 72 hours. Anemia Panel: No results for input(s): VITAMINB12, FOLATE, FERRITIN, TIBC, IRON, RETICCTPCT in the last 72 hours. Sepsis Labs:  Recent Labs Lab 02/19/17 0458  LATICACIDVEN 1.53    Recent Results (from the past 240 hour(s))  Urine culture      Status: Abnormal   Collection Time: 02/19/17  4:18 AM  Result Value Ref Range Status   Specimen Description URINE, RANDOM  Final   Special Requests NONE  Final   Culture MULTIPLE SPECIES PRESENT, SUGGEST RECOLLECTION (A)  Final   Report Status 02/20/2017 FINAL  Final  Blood Culture (routine x 2)     Status: None (Preliminary result)   Collection Time: 02/19/17  4:55 AM  Result Value Ref Range Status   Specimen Description BLOOD LEFT FOREARM  Final   Special Requests   Final    BOTTLES DRAWN AEROBIC AND ANAEROBIC Blood Culture adequate volume   Culture NO GROWTH 2 DAYS  Final   Report Status PENDING  Incomplete  Blood Culture (routine x 2)     Status: None (Preliminary result)   Collection Time: 02/19/17  4:57 AM  Result Value Ref Range Status   Specimen Description BLOOD RIGHT ANTECUBITAL  Final   Special Requests   Final    BOTTLES DRAWN AEROBIC AND ANAEROBIC Blood Culture adequate volume   Culture NO GROWTH 2 DAYS  Final   Report Status PENDING  Incomplete         Radiology Studies: No results found.      Scheduled Meds: . benztropine mesylate  1 mg Intravenous BID  . levETIRAcetam  500 mg Oral BID  . [START ON 02/22/2017] levothyroxine  75 mcg Oral QAC breakfast   Continuous Infusions: . sodium chloride 1,000 mL (02/21/17 0821)  . cefTRIAXone (ROCEPHIN)  IV Stopped (02/21/17 6270)     LOS: 2 days     Georgette Shell, MD [facility]  If 7PM-7AM, please contact night-coverage [email or web address] Password [email or web address] 02/21/2017, 3:36 PM

## 2017-02-22 MED ORDER — LORAZEPAM 0.5 MG PO TABS: 0.5000 mg | ORAL_TABLET | Freq: Two times a day (BID) | ORAL | 0 refills | Status: AC

## 2017-02-22 MED ORDER — HALOPERIDOL 2 MG PO TABS: 2.0000 mg | ORAL_TABLET | Freq: Two times a day (BID) | ORAL | 0 refills | Status: AC

## 2017-02-22 MED ORDER — LEVETIRACETAM 500 MG PO TABS: 500.0000 mg | ORAL_TABLET | Freq: Two times a day (BID) | ORAL | 0 refills | Status: AC

## 2017-02-22 MED ORDER — BENZTROPINE MESYLATE 1 MG PO TABS: 1.0000 mg | ORAL_TABLET | Freq: Two times a day (BID) | ORAL | 0 refills | Status: AC

## 2017-02-22 NOTE — NC FL2 (Signed)
[place] LEVEL OF CARE SCREENING TOOL     IDENTIFICATION  Patient Name: Wesley Howard Birthdate: 22-Feb-1941 Sex: male Admission Date (Current Location): 02/19/2017  [facility] and [place] Number:  Herbalist and Address:  [facility] [place] [address], [place], [place] [address]      Provider Number: [id number]  Attending Physician Name and Address:  Nita Sells, MD  Relative Name and Phone Number:  Vena Austria, [phone]    Current Level of Care: Hospital Recommended Level of Care: [place] Prior Approval Number:    Date Approved/Denied:   PASRR Number:    Discharge Plan: Other (Comment) (back to [facility])    Current Diagnoses: Patient Active Problem List   Diagnosis Date Noted  . Palliative care by specialist   . SAH (subarachnoid hemorrhage) ([place]) 02/19/2017  . UTI (urinary tract infection) 02/19/2017  . ARF (acute renal failure) ([place]) 02/19/2017  . Hypertension 02/19/2017  . Paranoid schizophrenia ([place]) 02/19/2017  . Subarachnoid hemorrhage ([place]) 02/19/2017    Orientation RESPIRATION BLADDER Height & Weight     Self  Normal Indwelling catheter (will go with pt after dc) Weight: 175 lb 11.2 oz (79.7 kg) Height:  5\' 6"  (167.6 cm) (Estimation)  BEHAVIORAL SYMPTOMS/MOOD NEUROLOGICAL BOWEL NUTRITION STATUS      Continent Diet (dsy3)  AMBULATORY STATUS COMMUNICATION OF NEEDS Skin   Extensive Assist Verbally (communication is impared ) Normal                       Personal Care Assistance Level of Assistance  Bathing, Feeding, Dressing Bathing Assistance: Maximum assistance Feeding assistance: Limited assistance Dressing Assistance: Maximum assistance     Functional Limitations Info  Sight, Hearing, Speech Sight Info: Adequate Hearing Info: Adequate Speech Info: Impaired    SPECIAL CARE FACTORS FREQUENCY  Speech therapy     PT Frequency: 3x wk OT Frequency: 3x wk      Speech Therapy Frequency: 1x wk      Contractures Contractures Info: Not present    Additional Factors Info  Code Status, Allergies Code Status Info: DNR Allergies Info: THIORIDAZINE            Current Medications (02/22/2017):  This is the current hospital active medication list Current Facility-Administered Medications  Medication Dose Route Frequency Provider Last Rate Last Dose  . 0.9 %  sodium chloride infusion  1,000 mL Intravenous Continuous Ward, Kristen N, DO 125 mL/hr at 02/22/17 0150 1,000 mL at 02/22/17 0150  . benztropine mesylate (COGENTIN) injection 1 mg  1 mg Intravenous BID Walden Field A, NP   1 mg at 02/22/17 1059  . cefTRIAXone (ROCEPHIN) 1 g in dextrose 5 % 50 mL IVPB  1 g Intravenous Q24H Jani Gravel, MD   Stopped at 02/22/17 0915  . hydrALAZINE (APRESOLINE) injection 10 mg  10 mg Intravenous Q4H PRN Georgette Shell, MD   10 mg at 02/22/17 0522  . levETIRAcetam (KEPPRA) tablet 500 mg  500 mg Oral BID Georgette Shell, MD   500 mg at 02/22/17 1059  . levothyroxine (SYNTHROID, LEVOTHROID) tablet 75 mcg  75 mcg Oral QAC breakfast Georgette Shell, MD   75 mcg at 02/22/17 0845  . LORazepam (ATIVAN) injection 0.5 mg  0.5 mg Intravenous Q6H PRN Nita Sells, MD   0.5 mg at 02/19/17 1049  . promethazine (PHENERGAN) injection 12.5 mg  12.5 mg Intravenous Q6H PRN Nita Sells, MD   12.5  mg at 02/19/17 8416     Discharge Medications: Please see discharge summary for a list of discharge medications.  Relevant Imaging Results:  Relevant Lab Results:   Additional Information SS#[id number]  Wende Neighbors, LCSW

## 2017-02-22 NOTE — Consult Note (Signed)
[facility] Primary Care Navigator  02/22/2017  Wesley Howard Sep 28, 1940 [id number]    Met with patientat the bedsideto identify possible discharge needs but he was not able to appropriately answer questions.  Called and spoke with patient's sister Wesley Howard) who reports that patient had a fall that resulted to this admission. Sister mentioned that patient has been residing at [facility] almost a year and a half with care needs being provided by facility staff that includes administering of medications from their pharmacy and transportation to his other doctors' appointments.  Patient's sister states that physicians in the facility are seeing and checking on patient there.  Per Inpatient CM report, discharge plan is to go back to [facility] living facility with Palliative Care to follow at the facility.  Sister denies any further needs or concerns at this time.    For questions, please contact:  Dannielle Huh, BSN, RN- [facility] Primary Care Navigator  Telephone: [phone] [place]

## 2017-02-22 NOTE — Care Management Note (Signed)
Case Management Note Marvetta Gibbons RN, BSN Unit 4E-Case Manager [phone]  Patient Details  Name: Wesley Howard MRN: [id number] Date of Birth: [birth year]  Subjective/Objective:   Pt admitted s/p fall with [facility]                 Action/Plan: P[facility] for [place]- pt may need higher level of care (SNF)  vs return to [facility] with PC following- CSW to follow up with sister for plan on discharge.   Expected Discharge Date:                  Expected Discharge Plan:  Skilled Nursing Facility  In-House Referral:  Clinical Social Work  Discharge planning Services  CM Consult  Post Acute Care Choice:    Choice offered to:     DME Arranged:    DME Agency:     HH Arranged:    [place] Agency:     Status of Service:  In process, will continue to follow  If discussed at Long Length of Stay Meetings, dates discussed:    Discharge Disposition:   Additional Comments:  Dawayne Patricia, RN 02/22/2017, 10:40 AM

## 2017-02-22 NOTE — Progress Notes (Signed)
Dr. Verlon Au notified that pt has not had a BM recorded since admission, mirilax requested.  Also asked about removing indwelling urinary catheter.  Per MD, pt is comfort care only and foley is to remain in place.  He will assess pt before addressing constipation.

## 2017-02-22 NOTE — Clinical Social Work Note (Signed)
Clinical Social Work Assessment  Patient Details  Name: Wesley Howard MRN: [id number] Date of Birth: 05-16-1941  Date of referral:  02/22/17               Reason for consult:  Discharge Planning, Facility Placement                Permission sought to share information with:  Family Supports Permission granted to share information::  Yes, Verbal Permission Granted  Name::     Vena Austria  Agency::  [facility]  Relationship::  sister   Contact Information:  [phone]  Housing/Transportation Living arrangements for the past 2 months:  [place] of Information:  Other (Comment Required) (sister) Patient Interpreter Needed:  None Criminal Activity/Legal Involvement Pertinent to Current Situation/Hospitalization:  No - Comment as needed Significant Relationships:  Siblings Lives with:  Facility Resident Do you feel safe going back to the place where you live?  Yes Need for family participation in patient care:  Yes (Comment)  Care giving concerns:  No family at bedside  Social Worker assessment / plan:  CSW unable to assess patient as patient is only orient to self. CSW spoke to patients sister via phone. Sister stated patient has been living at [facility] for 1 1/2 yr. Sister stated she wants patient to discharge back to facility. CSW made facility  Aware of patient return to facility. CSW faxed over summary and fl2 Employment status:  Retired Nurse, adult PT Recommendations:  Not assessed at this time Information / Referral to community resources:  Other (Comment Required) (pt returning to ALF)  Patient/Family's Response to care:  Family appreciative of CSW role in patients care  Patient/Family's Understanding of and Emotional Response to Diagnosis, Current Treatment, and Prognosis: Family and patients facility aware of patients hospitalization and current limitations  Emotional Assessment Appearance:   Appears stated age Attitude/Demeanor/Rapport:  Unable to Assess Affect (typically observed):  Unable to Assess Orientation:  Oriented to Self Alcohol / Substance use:  Not Applicable Psych involvement (Current and /or in the community):  No (Comment)  Discharge Needs  Concerns to be addressed:  No discharge needs identified Readmission within the last 30 days:  No Current discharge risk:  None Barriers to Discharge:  No Barriers Identified   Wende Neighbors, LCSW 02/22/2017, 12:09 PM

## 2017-02-22 NOTE — Discharge Summary (Signed)
Physician Discharge Summary  Wesley Howard:[id number] DOB: January 24, 1941 DOA: 02/19/2017  PCP: Raelyn Number, MD  Admit date: 02/19/2017 Discharge date: 02/22/2017  Time spent: 15 minutes  Recommendations for Outpatient Follow-up:  1. Patient to proceed with end of life care at [facility] 2. Palliative to be consulted and should follow at facility on d/c 3.   Discharge Diagnoses:  Principal Problem:   SAH (subarachnoid hemorrhage) (HCC) Active Problems:   UTI (urinary tract infection)   ARF (acute renal failure) (HCC)   Hypertension   Paranoid schizophrenia ([place])   Subarachnoid hemorrhage ([place])   Palliative care by specialist   Discharge Condition: gaurded  Diet recommendation: hh low salt  Filed Weights   02/20/17 0500 02/21/17 0430 02/22/17 0400  Weight: 79 kg (174 lb 3.2 oz) 79.7 kg (175 lb 9.6 oz) 79.7 kg (175 lb 11.2 oz)    History of present illness:  76 year old male [facility] assisted living resident Essential tremor, paranoid schizophrenia, dysphagia  Fell at ALF andunable to provide any history and was hypothermic on arrival CT brain showed 18 mm cerebellar hemorrhageand subarachnoidointment with minimal adjacent edema minimal layering and loss of height of C7 vertebral body with endplate fracture Emergency room spoke to power of attorney did not want surgical intervention  Hospital Course:  Patient was seen by palliative care Goals were clarified and the patient was felt to be stabilized for d/c   Consultations:  neurosurgery  Discharge Exam: Vitals:   02/22/17 0400 02/22/17 0522  BP: (!) 170/90 (!) 175/102  Pulse:    Resp: 17   Temp: 98.1 F (36.7 C)   SpO2: 99%     General: awake alert more conversant than prior Cardiovascular: s1 s2 no m/r/g Respiratory: clear no added sound Neuro intact  Discharge Instructions    Current Discharge Medication List    CONTINUE these medications which have CHANGED   Details  benztropine  (COGENTIN) 1 MG tablet Take 1 tablet (1 mg total) by mouth 2 (two) times daily. Qty: 4 tablet, Refills: 0    haloperidol (HALDOL) 2 MG tablet Take 1 tablet (2 mg total) by mouth 2 (two) times daily. Qty: 4 tablet, Refills: 0    levETIRAcetam (KEPPRA) 500 MG tablet Take 1 tablet (500 mg total) by mouth 2 (two) times daily. Qty: 4 tablet, Refills: 0    LORazepam (ATIVAN) 0.5 MG tablet Take 1 tablet (0.5 mg total) by mouth 2 (two) times daily. Qty: 4 tablet, Refills: 0      STOP taking these medications     aspirin 81 MG chewable tablet      levothyroxine (SYNTHROID, LEVOTHROID) 75 MCG tablet      metoprolol tartrate (LOPRESSOR) 50 MG tablet      potassium chloride SA (K-DUR,KLOR-CON) 20 MEQ tablet      ranitidine (ZANTAC) 150 MG tablet      trimethoprim (TRIMPEX) 100 MG tablet        Allergies  Allergen Reactions  . Thioridazine Other (See Comments)    Unknown reaction       The results of significant diagnostics from this hospitalization (including imaging, microbiology, ancillary and laboratory) are listed below for reference.    Significant Diagnostic Studies: Ct Head Wo Contrast  Result Date: 02/19/2017 CLINICAL DATA:  Unwitnessed fall today striking head. EXAM: CT HEAD WITHOUT CONTRAST CT CERVICAL SPINE WITHOUT CONTRAST TECHNIQUE: Multidetector CT imaging of the head and cervical spine was performed following the standard protocol without intravenous contrast. Multiplanar CT image  reconstructions of the cervical spine were also generated. COMPARISON:  Head and cervical spine CT 06/03/2016 FINDINGS: CT HEAD FINDINGS Brain: Right posterior fossa hemorrhage measures 18 x 18 mm, this is likely in the subarachnoid space with minimal intraventricular hemorrhage layering in the posterior horn of the left lateral ventricle. Mild associated cerebellar edema. No developing hydrocephalus, basilar cisterns are patent. There is generalized atrophy and chronic small vessel ischemia. No  subdural hematoma. Vascular: Atherosclerosis of skullbase vasculature without hyperdense vessel or abnormal calcification. Skull: No fracture or focal lesion. Sinuses/Orbits: Remote nasal bone fracture with leftward nasal deviation. Left cataract resection. No acute finding. Other: None. CT CERVICAL SPINE FINDINGS Alignment: Exaggerated cervical lordosis.  No traumatic listhesis. Skull base and vertebrae: Mild loss of height superior endplate of C7 with fragmentation has progressed from prior exam but does not appear acute. Remaining vertebral body heights are preserved. Sclerotic lesion known right C7 pedicle is unchanged and likely a bone island. The dens and skull base are intact. Soft tissues and spinal canal: No prevertebral fluid or swelling. No visible canal hematoma. Disc levels: Disc space narrowing and endplate spurring at A6-T0 and C5-C6. Upper chest: No acute abnormality. Other: None. IMPRESSION: 1. Right 18 mm cerebellar hemorrhage, likely subarachnoid. Minimal adjacent edema. Minimal layering intraventricular hemorrhage in the temple of the left lateral ventricle No mass effect. 2. Increased loss of height of C7 vertebral body with fragmentation of the superior endplate, progressed from prior exam but appears chronic. No evidence of acute cervical spine fracture. Critical Value/emergent results were called by telephone at the time of interpretation on 02/19/2017 at 5:59 am to Dr. Pryor Curia , who verbally acknowledged these results. Electronically Signed   By: Jeb Levering M.D.   On: 02/19/2017 05:59   Ct Cervical Spine Wo Contrast  Result Date: 02/19/2017 CLINICAL DATA:  Unwitnessed fall today striking head. EXAM: CT HEAD WITHOUT CONTRAST CT CERVICAL SPINE WITHOUT CONTRAST TECHNIQUE: Multidetector CT imaging of the head and cervical spine was performed following the standard protocol without intravenous contrast. Multiplanar CT image reconstructions of the cervical spine were also generated.  COMPARISON:  Head and cervical spine CT 06/03/2016 FINDINGS: CT HEAD FINDINGS Brain: Right posterior fossa hemorrhage measures 18 x 18 mm, this is likely in the subarachnoid space with minimal intraventricular hemorrhage layering in the posterior horn of the left lateral ventricle. Mild associated cerebellar edema. No developing hydrocephalus, basilar cisterns are patent. There is generalized atrophy and chronic small vessel ischemia. No subdural hematoma. Vascular: Atherosclerosis of skullbase vasculature without hyperdense vessel or abnormal calcification. Skull: No fracture or focal lesion. Sinuses/Orbits: Remote nasal bone fracture with leftward nasal deviation. Left cataract resection. No acute finding. Other: None. CT CERVICAL SPINE FINDINGS Alignment: Exaggerated cervical lordosis.  No traumatic listhesis. Skull base and vertebrae: Mild loss of height superior endplate of C7 with fragmentation has progressed from prior exam but does not appear acute. Remaining vertebral body heights are preserved. Sclerotic lesion known right C7 pedicle is unchanged and likely a bone island. The dens and skull base are intact. Soft tissues and spinal canal: No prevertebral fluid or swelling. No visible canal hematoma. Disc levels: Disc space narrowing and endplate spurring at Z6-W1 and C5-C6. Upper chest: No acute abnormality. Other: None. IMPRESSION: 1. Right 18 mm cerebellar hemorrhage, likely subarachnoid. Minimal adjacent edema. Minimal layering intraventricular hemorrhage in the temple of the left lateral ventricle No mass effect. 2. Increased loss of height of C7 vertebral body with fragmentation of the superior endplate, progressed from  prior exam but appears chronic. No evidence of acute cervical spine fracture. Critical Value/emergent results were called by telephone at the time of interpretation on 02/19/2017 at 5:59 am to Dr. Pryor Curia , who verbally acknowledged these results. Electronically Signed   By: Jeb Levering M.D.   On: 02/19/2017 05:59   US Renal  Result Date: 02/19/2017 CLINICAL DATA:  Acute renal failure EXAM: RENAL / URINARY TRACT ULTRASOUND COMPLETE COMPARISON:  CT scan 01/28/2017 FINDINGS: Right Kidney: Length: 11.2 cm. Cortical thinning with increased echogenicity of the renal parenchyma. No hydronephrosis. Left Kidney: Length: 10.2 cm. Cortical thinning with increased echogenicity. No hydronephrosis. 3.8 cm hypoechoic lesion identified towards the upper pole not well seen on recent noncontrast CT. Bladder: Appears normal for degree of bladder distention. IMPRESSION: 1. No evidence for hydronephrosis. 2. Echogenic kidneys bilaterally suggesting medical renal disease. 3. 3.8 cm hypoechoic left upper pole renal lesion not well seen on the recent noncontrast CT exam. Although patient renal function may preclude contrast, MRI may provide some additional insight. Alternatively, follow-up ultrasound could be used to ensure stability of this lesion. Electronically Signed   By: Misty Stanley M.D.   On: 02/19/2017 15:17   Dg Chest Port 1 View  Result Date: 02/19/2017 CLINICAL DATA:  Unwitnessed fall. EXAM: PORTABLE CHEST 1 VIEW COMPARISON:  Chest radiograph 11/05/2016 FINDINGS: Unchanged cardiomegaly and mediastinal contours. Mild bibasilar atelectasis. No pulmonary edema, pleural effusion or pneumothorax. No confluent airspace disease. Chronic left shoulder dislocation. IMPRESSION: Bibasilar atelectasis.  Stable mild cardiomegaly. Electronically Signed   By: Jeb Levering M.D.   On: 02/19/2017 05:27   Dg Shoulder Left Portable  Result Date: 02/19/2017 CLINICAL DATA:  Post unwitnessed fall.  Question dislocation. EXAM: LEFT SHOULDER - 1 VIEW COMPARISON:  Radiograph 11/05/2016 FINDINGS: Chronic anterior left shoulder dislocation. Alignment is unchanged from prior exam. Large Hill-Sachs impaction injury to the lateral humeral head is unchanged. Glenoid appear shallow. Acromioclavicular joint is  congruent. 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 Alignment is unchanged from exam 3 months prior. Electronically Signed   By: Jeb Levering M.D.   On: 02/19/2017 05:28    Microbiology: Recent Results (from the past 240 hour(s))  Urine culture     Status: Abnormal   Collection Time: 02/19/17  4:18 AM  Result Value Ref Range Status   Specimen Description URINE, RANDOM  Final   Special Requests NONE  Final   Culture MULTIPLE SPECIES PRESENT, SUGGEST RECOLLECTION (A)  Final   Report Status 02/20/2017 FINAL  Final  Blood Culture (routine x 2)     Status: None (Preliminary result)   Collection Time: 02/19/17  4:55 AM  Result Value Ref Range Status   Specimen Description BLOOD LEFT FOREARM  Final   Special Requests   Final    BOTTLES DRAWN AEROBIC AND ANAEROBIC Blood Culture adequate volume   Culture NO GROWTH 2 DAYS  Final   Report Status PENDING  Incomplete  Blood Culture (routine x 2)     Status: None (Preliminary result)   Collection Time: 02/19/17  4:57 AM  Result Value Ref Range Status   Specimen Description BLOOD RIGHT ANTECUBITAL  Final   Special Requests   Final    BOTTLES DRAWN AEROBIC AND ANAEROBIC Blood Culture adequate volume   Culture NO GROWTH 2 DAYS  Final   Report Status PENDING  Incomplete     Labs: Basic Metabolic Panel:  Recent Labs Lab 02/19/17 0433  NA 133*  K 3.8  CL  101  CO2 25  GLUCOSE 116*  BUN 23*  CREATININE 1.74*  CALCIUM 9.1   Liver Function Tests:  Recent Labs Lab 02/19/17 0433  AST 17  ALT 12*  ALKPHOS 84  BILITOT 0.9  PROT 6.9  ALBUMIN 3.8   No results for input(s): LIPASE, AMYLASE in the last 168 hours. No results for input(s): AMMONIA in the last 168 hours. CBC:  Recent Labs Lab 02/19/17 0433  WBC 12.2*  NEUTROABS 11.0*  HGB 13.0  HCT 37.8*  MCV 92.6  PLT 187   Cardiac Enzymes: No results for input(s): CKTOTAL, CKMB, CKMBINDEX, TROPONINI in the last 168 hours. BNP: BNP  (last 3 results) No results for input(s): BNP in the last 8760 hours.  ProBNP (last 3 results) No results for input(s): PROBNP in the last 8760 hours.  CBG: No results for input(s): GLUCAP in the last 168 hours.     SignedNita Sells MD   [facility] 02/22/2017, 9:05 AM

## 2017-02-22 NOTE — Progress Notes (Signed)
Clinical Social Worker facilitated patient discharge including contacting patient family and facility to confirm patient discharge plans.  Clinical information faxed to facility and family agreeable with plan. Patients facility stated they will pick up patient and take him back [facility] .  RN Estill Bamberg to call [phone] and ask to speak to Mariann Laster for report prior to discharge.  Clinical Social Worker will sign off for now as social work intervention is no longer needed. Please consult [place] again if new need arises.  Rhea Pink, MSW, [place]

## 2017-02-24 LAB — CULTURE, BLOOD (ROUTINE X 2)
CULTURE: NO GROWTH
Culture: NO GROWTH
SPECIAL REQUESTS: ADEQUATE
Special Requests: ADEQUATE

## 2017-02-25 DIAGNOSIS — N390 Urinary tract infection, site not specified: Secondary | ICD-10-CM | POA: Diagnosis not present

## 2017-02-25 DIAGNOSIS — I10 Essential (primary) hypertension: Secondary | ICD-10-CM | POA: Diagnosis not present

## 2017-02-25 DIAGNOSIS — I609 Nontraumatic subarachnoid hemorrhage, unspecified: Secondary | ICD-10-CM | POA: Diagnosis not present

## 2017-02-25 DIAGNOSIS — F200 Paranoid schizophrenia: Secondary | ICD-10-CM | POA: Diagnosis not present

## 2017-02-26 DIAGNOSIS — T83021A Displacement of indwelling urethral catheter, initial encounter: Secondary | ICD-10-CM | POA: Diagnosis not present

## 2017-02-26 DIAGNOSIS — T83028A Displacement of other indwelling urethral catheter, initial encounter: Secondary | ICD-10-CM | POA: Diagnosis not present

## 2017-03-03 DIAGNOSIS — S065X9A Traumatic subdural hemorrhage with loss of consciousness of unspecified duration, initial encounter: Secondary | ICD-10-CM | POA: Diagnosis not present

## 2017-03-03 DIAGNOSIS — R339 Retention of urine, unspecified: Secondary | ICD-10-CM | POA: Diagnosis not present

## 2017-03-03 DIAGNOSIS — I10 Essential (primary) hypertension: Secondary | ICD-10-CM | POA: Diagnosis not present

## 2017-03-05 DIAGNOSIS — M25559 Pain in unspecified hip: Secondary | ICD-10-CM | POA: Diagnosis not present

## 2017-03-26 DIAGNOSIS — Z9181 History of falling: Secondary | ICD-10-CM | POA: Diagnosis not present

## 2017-03-26 DIAGNOSIS — R339 Retention of urine, unspecified: Secondary | ICD-10-CM | POA: Diagnosis not present

## 2017-03-26 DIAGNOSIS — F411 Generalized anxiety disorder: Secondary | ICD-10-CM | POA: Diagnosis not present

## 2017-03-26 DIAGNOSIS — I10 Essential (primary) hypertension: Secondary | ICD-10-CM | POA: Diagnosis not present

## 2017-03-26 DIAGNOSIS — N319 Neuromuscular dysfunction of bladder, unspecified: Secondary | ICD-10-CM | POA: Diagnosis not present

## 2017-03-26 DIAGNOSIS — F200 Paranoid schizophrenia: Secondary | ICD-10-CM | POA: Diagnosis not present

## 2017-03-26 DIAGNOSIS — Z466 Encounter for fitting and adjustment of urinary device: Secondary | ICD-10-CM | POA: Diagnosis not present

## 2017-03-26 DIAGNOSIS — G259 Extrapyramidal and movement disorder, unspecified: Secondary | ICD-10-CM | POA: Diagnosis not present

## 2017-03-30 DIAGNOSIS — Z79899 Other long term (current) drug therapy: Secondary | ICD-10-CM | POA: Diagnosis not present

## 2017-03-30 DIAGNOSIS — D518 Other vitamin B12 deficiency anemias: Secondary | ICD-10-CM | POA: Diagnosis not present

## 2017-03-30 DIAGNOSIS — E7849 Other hyperlipidemia: Secondary | ICD-10-CM | POA: Diagnosis not present

## 2017-03-30 DIAGNOSIS — E559 Vitamin D deficiency, unspecified: Secondary | ICD-10-CM | POA: Diagnosis not present

## 2017-03-30 DIAGNOSIS — E119 Type 2 diabetes mellitus without complications: Secondary | ICD-10-CM | POA: Diagnosis not present

## 2017-03-30 DIAGNOSIS — E038 Other specified hypothyroidism: Secondary | ICD-10-CM | POA: Diagnosis not present

## 2017-03-31 DIAGNOSIS — L309 Dermatitis, unspecified: Secondary | ICD-10-CM | POA: Diagnosis not present

## 2017-03-31 DIAGNOSIS — E039 Hypothyroidism, unspecified: Secondary | ICD-10-CM | POA: Diagnosis not present

## 2017-03-31 DIAGNOSIS — N319 Neuromuscular dysfunction of bladder, unspecified: Secondary | ICD-10-CM | POA: Diagnosis not present

## 2017-03-31 DIAGNOSIS — N189 Chronic kidney disease, unspecified: Secondary | ICD-10-CM | POA: Diagnosis not present

## 2017-04-02 DIAGNOSIS — I739 Peripheral vascular disease, unspecified: Secondary | ICD-10-CM | POA: Diagnosis not present

## 2017-04-02 DIAGNOSIS — M21612 Bunion of left foot: Secondary | ICD-10-CM | POA: Diagnosis not present

## 2017-04-02 DIAGNOSIS — B351 Tinea unguium: Secondary | ICD-10-CM | POA: Diagnosis not present

## 2017-04-08 DIAGNOSIS — I10 Essential (primary) hypertension: Secondary | ICD-10-CM | POA: Diagnosis not present

## 2017-04-08 DIAGNOSIS — R339 Retention of urine, unspecified: Secondary | ICD-10-CM | POA: Diagnosis not present

## 2017-04-08 DIAGNOSIS — Z466 Encounter for fitting and adjustment of urinary device: Secondary | ICD-10-CM | POA: Diagnosis not present

## 2017-04-08 DIAGNOSIS — N319 Neuromuscular dysfunction of bladder, unspecified: Secondary | ICD-10-CM | POA: Diagnosis not present

## 2017-04-13 DIAGNOSIS — I10 Essential (primary) hypertension: Secondary | ICD-10-CM | POA: Diagnosis not present

## 2017-04-13 DIAGNOSIS — Z0000 Encounter for general adult medical examination without abnormal findings: Secondary | ICD-10-CM | POA: Diagnosis not present

## 2017-04-13 DIAGNOSIS — M6281 Muscle weakness (generalized): Secondary | ICD-10-CM | POA: Diagnosis not present

## 2017-04-13 DIAGNOSIS — E039 Hypothyroidism, unspecified: Secondary | ICD-10-CM | POA: Diagnosis not present

## 2017-04-13 DIAGNOSIS — R262 Difficulty in walking, not elsewhere classified: Secondary | ICD-10-CM | POA: Diagnosis not present

## 2017-04-14 ENCOUNTER — Ambulatory Visit: Payer: Medicaid Other | Admitting: Sports Medicine

## 2017-04-26 DIAGNOSIS — E119 Type 2 diabetes mellitus without complications: Secondary | ICD-10-CM | POA: Diagnosis not present

## 2017-04-26 DIAGNOSIS — E7849 Other hyperlipidemia: Secondary | ICD-10-CM | POA: Diagnosis not present

## 2017-04-26 DIAGNOSIS — Z79899 Other long term (current) drug therapy: Secondary | ICD-10-CM | POA: Diagnosis not present

## 2017-04-26 DIAGNOSIS — E038 Other specified hypothyroidism: Secondary | ICD-10-CM | POA: Diagnosis not present

## 2017-04-26 DIAGNOSIS — D518 Other vitamin B12 deficiency anemias: Secondary | ICD-10-CM | POA: Diagnosis not present

## 2017-04-29 DIAGNOSIS — R509 Fever, unspecified: Secondary | ICD-10-CM | POA: Diagnosis not present

## 2017-04-29 DIAGNOSIS — R05 Cough: Secondary | ICD-10-CM | POA: Diagnosis not present

## 2017-05-05 DIAGNOSIS — J069 Acute upper respiratory infection, unspecified: Secondary | ICD-10-CM | POA: Diagnosis not present

## 2017-05-11 DIAGNOSIS — G4489 Other headache syndrome: Secondary | ICD-10-CM | POA: Diagnosis not present

## 2017-05-11 DIAGNOSIS — S0990XA Unspecified injury of head, initial encounter: Secondary | ICD-10-CM | POA: Diagnosis not present

## 2017-05-11 DIAGNOSIS — S0003XA Contusion of scalp, initial encounter: Secondary | ICD-10-CM | POA: Diagnosis not present

## 2017-05-11 DIAGNOSIS — S199XXA Unspecified injury of neck, initial encounter: Secondary | ICD-10-CM | POA: Diagnosis not present

## 2017-05-12 DIAGNOSIS — S0510XD Contusion of eyeball and orbital tissues, unspecified eye, subsequent encounter: Secondary | ICD-10-CM | POA: Diagnosis not present

## 2017-05-12 DIAGNOSIS — J069 Acute upper respiratory infection, unspecified: Secondary | ICD-10-CM | POA: Diagnosis not present

## 2017-05-12 DIAGNOSIS — R269 Unspecified abnormalities of gait and mobility: Secondary | ICD-10-CM | POA: Diagnosis not present

## 2017-05-12 DIAGNOSIS — N400 Enlarged prostate without lower urinary tract symptoms: Secondary | ICD-10-CM | POA: Diagnosis not present

## 2017-05-25 DIAGNOSIS — N319 Neuromuscular dysfunction of bladder, unspecified: Secondary | ICD-10-CM | POA: Diagnosis not present

## 2017-05-25 DIAGNOSIS — N320 Bladder-neck obstruction: Secondary | ICD-10-CM | POA: Diagnosis not present

## 2017-05-25 DIAGNOSIS — Z466 Encounter for fitting and adjustment of urinary device: Secondary | ICD-10-CM | POA: Diagnosis not present

## 2017-05-25 DIAGNOSIS — I10 Essential (primary) hypertension: Secondary | ICD-10-CM | POA: Diagnosis not present

## 2017-05-25 DIAGNOSIS — R4182 Altered mental status, unspecified: Secondary | ICD-10-CM | POA: Diagnosis not present

## 2017-05-25 DIAGNOSIS — Z9181 History of falling: Secondary | ICD-10-CM | POA: Diagnosis not present

## 2017-05-25 DIAGNOSIS — Z7982 Long term (current) use of aspirin: Secondary | ICD-10-CM | POA: Diagnosis not present

## 2017-05-25 DIAGNOSIS — R339 Retention of urine, unspecified: Secondary | ICD-10-CM | POA: Diagnosis not present

## 2017-05-25 DIAGNOSIS — E039 Hypothyroidism, unspecified: Secondary | ICD-10-CM | POA: Diagnosis not present

## 2017-05-25 DIAGNOSIS — T83091A Other mechanical complication of indwelling urethral catheter, initial encounter: Secondary | ICD-10-CM | POA: Diagnosis not present

## 2017-05-25 DIAGNOSIS — T839XXA Unspecified complication of genitourinary prosthetic device, implant and graft, initial encounter: Secondary | ICD-10-CM | POA: Diagnosis not present

## 2017-05-25 DIAGNOSIS — F0390 Unspecified dementia without behavioral disturbance: Secondary | ICD-10-CM | POA: Diagnosis not present

## 2017-05-26 ENCOUNTER — Ambulatory Visit: Payer: Medicaid Other | Admitting: Sports Medicine

## 2017-06-04 ENCOUNTER — Encounter: Payer: Self-pay | Admitting: Sports Medicine

## 2017-06-04 ENCOUNTER — Ambulatory Visit (INDEPENDENT_AMBULATORY_CARE_PROVIDER_SITE_OTHER): Payer: PPO | Admitting: Sports Medicine

## 2017-06-04 DIAGNOSIS — M79675 Pain in left toe(s): Secondary | ICD-10-CM

## 2017-06-04 DIAGNOSIS — F200 Paranoid schizophrenia: Secondary | ICD-10-CM | POA: Diagnosis not present

## 2017-06-04 DIAGNOSIS — F411 Generalized anxiety disorder: Secondary | ICD-10-CM | POA: Diagnosis not present

## 2017-06-04 DIAGNOSIS — B351 Tinea unguium: Secondary | ICD-10-CM

## 2017-06-04 DIAGNOSIS — Q828 Other specified congenital malformations of skin: Secondary | ICD-10-CM

## 2017-06-04 DIAGNOSIS — G259 Extrapyramidal and movement disorder, unspecified: Secondary | ICD-10-CM | POA: Diagnosis not present

## 2017-06-04 DIAGNOSIS — M79674 Pain in right toe(s): Secondary | ICD-10-CM

## 2017-06-04 NOTE — Progress Notes (Signed)
Subjective: Wesley Howard is a 77 y.o. male patient seen today in office with complaint of painful thickened and elongated toenails and callus; unable to trim. [facility] from [facility]. Patient is assisted by facility caregiver and has no other pedal complaints at this time.   Patient Active Problem List   Diagnosis Date Noted  . Palliative care by specialist   . SAH (subarachnoid hemorrhage) ([place]) 02/19/2017  . UTI (urinary tract infection) 02/19/2017  . ARF (acute renal failure) ([place]) 02/19/2017  . Hypertension 02/19/2017  . Paranoid schizophrenia ([place]) 02/19/2017  . Subarachnoid hemorrhage ([place]) 02/19/2017    Current Outpatient Medications on File Prior to Visit  Medication Sig Dispense Refill  . amLODipine (NORVASC) 2.5 MG tablet     . haloperidol (HALDOL) 2 MG tablet Take 1 tablet (2 mg total) by mouth 2 (two) times daily. 4 tablet 0  . levETIRAcetam (KEPPRA) 500 MG tablet Take 1 tablet (500 mg total) by mouth 2 (two) times daily. 4 tablet 0  . levothyroxine (SYNTHROID, LEVOTHROID) 25 MCG tablet     . levothyroxine (SYNTHROID, LEVOTHROID) 50 MCG tablet     . LORazepam (ATIVAN) 0.5 MG tablet Take 1 tablet (0.5 mg total) by mouth 2 (two) times daily. 4 tablet 0  . benztropine (COGENTIN) 1 MG tablet Take 1 tablet (1 mg total) by mouth 2 (two) times daily. (Patient not taking: Reported on 06/04/2017) 4 tablet 0   No current facility-administered medications on file prior to visit.     Allergies  Allergen Reactions  . Thioridazine Other (See Comments)    Unknown reaction     Objective: Physical Exam  General: Well developed, nourished, no acute distress, awake, alert and oriented x 2. Uses wheelchair.   Vascular: Dorsalis pedis artery 2/4 bilateral, Posterior tibial artery 1/4 bilateral, skin temperature warm to warm proximal to distal bilateral lower extremities, no varicosities, pedal hair present bilateral.  Neurological: Gross sensation present via light touch  bilateral.   Dermatological: Old scar at right medial ankle, that is well healed. Skin is warm, dry, and supple bilateral, Nails 1-10 are tender, long, thick, and discolored with moderate subungal debris, no webspace macerations present bilateral, no open lesions present bilateral,+ callus/hyperkeratotic tissue present sub met 5 and hallux on right with mild dry heme. No underlying opening. No signs of infection bilateral.  Musculoskeletal: No symptomatic boney deformities noted bilateral. Muscular strength within normal limits without painon range of motion. No pain with calf compression bilateral.  Assessment and Plan:  Problem List Items Addressed This Visit      Other   Paranoid schizophrenia ([place])    Other Visit Diagnoses    Pain due to onychomycosis of toenails of both feet    -  Primary   Porokeratosis         -Examined patient.  -Discussed treatment options for painful mycotic nails and callus . -Mechanically debrided callus x 2 using sterile chisel blade and reduced mycotic nails with sterile nail nipper and dremel nail file.  While I was debriding the callus is the patient move and encounter a additional traumatic cut from the chisel blade of which are dressed with antibiotic cream and a Band-Aid and advised patient and facility to continue to do the same until area is healed if there are any acute changes to return to the office immediately or to go to ER. -Continue with good supportive shoes and wheelchair to assist with gait.  -Patient to return in 3 months  for follow up evaluation or sooner if symptoms worsen.  Landis Martins, DPM

## 2017-06-08 DIAGNOSIS — R339 Retention of urine, unspecified: Secondary | ICD-10-CM | POA: Diagnosis not present

## 2017-06-08 DIAGNOSIS — N319 Neuromuscular dysfunction of bladder, unspecified: Secondary | ICD-10-CM | POA: Diagnosis not present

## 2017-06-08 DIAGNOSIS — Z466 Encounter for fitting and adjustment of urinary device: Secondary | ICD-10-CM | POA: Diagnosis not present

## 2017-06-09 DIAGNOSIS — S3994XA Unspecified injury of external genitals, initial encounter: Secondary | ICD-10-CM | POA: Diagnosis not present

## 2017-06-09 DIAGNOSIS — R4182 Altered mental status, unspecified: Secondary | ICD-10-CM | POA: Diagnosis not present

## 2017-06-09 DIAGNOSIS — T83091A Other mechanical complication of indwelling urethral catheter, initial encounter: Secondary | ICD-10-CM | POA: Diagnosis not present

## 2017-06-09 DIAGNOSIS — S3120XA Unspecified open wound of penis, initial encounter: Secondary | ICD-10-CM | POA: Diagnosis not present

## 2017-06-22 DIAGNOSIS — D518 Other vitamin B12 deficiency anemias: Secondary | ICD-10-CM | POA: Diagnosis not present

## 2017-06-22 DIAGNOSIS — E559 Vitamin D deficiency, unspecified: Secondary | ICD-10-CM | POA: Diagnosis not present

## 2017-06-22 DIAGNOSIS — E038 Other specified hypothyroidism: Secondary | ICD-10-CM | POA: Diagnosis not present

## 2017-06-22 DIAGNOSIS — Z79899 Other long term (current) drug therapy: Secondary | ICD-10-CM | POA: Diagnosis not present

## 2017-06-22 DIAGNOSIS — E119 Type 2 diabetes mellitus without complications: Secondary | ICD-10-CM | POA: Diagnosis not present

## 2017-06-22 DIAGNOSIS — E7849 Other hyperlipidemia: Secondary | ICD-10-CM | POA: Diagnosis not present

## 2017-06-24 DIAGNOSIS — T83021A Displacement of indwelling urethral catheter, initial encounter: Secondary | ICD-10-CM | POA: Diagnosis not present

## 2017-06-24 DIAGNOSIS — T83198A Other mechanical complication of other urinary devices and implants, initial encounter: Secondary | ICD-10-CM | POA: Diagnosis not present

## 2017-06-24 DIAGNOSIS — T83498A Other mechanical complication of other prosthetic devices, implants and grafts of genital tract, initial encounter: Secondary | ICD-10-CM | POA: Diagnosis not present

## 2017-06-30 DIAGNOSIS — E039 Hypothyroidism, unspecified: Secondary | ICD-10-CM | POA: Diagnosis not present

## 2017-06-30 DIAGNOSIS — N319 Neuromuscular dysfunction of bladder, unspecified: Secondary | ICD-10-CM | POA: Diagnosis not present

## 2017-06-30 DIAGNOSIS — I10 Essential (primary) hypertension: Secondary | ICD-10-CM | POA: Diagnosis not present

## 2017-06-30 DIAGNOSIS — M189 Osteoarthritis of first carpometacarpal joint, unspecified: Secondary | ICD-10-CM | POA: Diagnosis not present

## 2017-07-07 DIAGNOSIS — R339 Retention of urine, unspecified: Secondary | ICD-10-CM | POA: Diagnosis not present

## 2017-07-07 DIAGNOSIS — N189 Chronic kidney disease, unspecified: Secondary | ICD-10-CM | POA: Diagnosis not present

## 2017-07-09 DIAGNOSIS — B351 Tinea unguium: Secondary | ICD-10-CM | POA: Diagnosis not present

## 2017-07-09 DIAGNOSIS — L84 Corns and callosities: Secondary | ICD-10-CM | POA: Diagnosis not present

## 2017-07-09 DIAGNOSIS — I739 Peripheral vascular disease, unspecified: Secondary | ICD-10-CM | POA: Diagnosis not present

## 2017-07-14 DIAGNOSIS — N3020 Other chronic cystitis without hematuria: Secondary | ICD-10-CM | POA: Diagnosis not present

## 2017-07-14 DIAGNOSIS — N401 Enlarged prostate with lower urinary tract symptoms: Secondary | ICD-10-CM | POA: Diagnosis not present

## 2017-07-14 DIAGNOSIS — C671 Malignant neoplasm of dome of bladder: Secondary | ICD-10-CM | POA: Diagnosis not present

## 2017-07-14 DIAGNOSIS — N3090 Cystitis, unspecified without hematuria: Secondary | ICD-10-CM | POA: Diagnosis not present

## 2017-07-14 DIAGNOSIS — I10 Essential (primary) hypertension: Secondary | ICD-10-CM | POA: Diagnosis not present

## 2017-07-17 DIAGNOSIS — G8911 Acute pain due to trauma: Secondary | ICD-10-CM | POA: Diagnosis not present

## 2017-07-17 DIAGNOSIS — R339 Retention of urine, unspecified: Secondary | ICD-10-CM | POA: Diagnosis not present

## 2017-07-17 DIAGNOSIS — T83091A Other mechanical complication of indwelling urethral catheter, initial encounter: Secondary | ICD-10-CM | POA: Diagnosis not present

## 2017-07-19 DIAGNOSIS — D518 Other vitamin B12 deficiency anemias: Secondary | ICD-10-CM | POA: Diagnosis not present

## 2017-07-19 DIAGNOSIS — Z79899 Other long term (current) drug therapy: Secondary | ICD-10-CM | POA: Diagnosis not present

## 2017-07-19 DIAGNOSIS — E7849 Other hyperlipidemia: Secondary | ICD-10-CM | POA: Diagnosis not present

## 2017-07-19 DIAGNOSIS — E119 Type 2 diabetes mellitus without complications: Secondary | ICD-10-CM | POA: Diagnosis not present

## 2017-07-29 DIAGNOSIS — G4700 Insomnia, unspecified: Secondary | ICD-10-CM | POA: Diagnosis not present

## 2017-07-29 DIAGNOSIS — F200 Paranoid schizophrenia: Secondary | ICD-10-CM | POA: Diagnosis not present

## 2017-07-29 DIAGNOSIS — F411 Generalized anxiety disorder: Secondary | ICD-10-CM | POA: Diagnosis not present

## 2017-07-29 DIAGNOSIS — F451 Undifferentiated somatoform disorder: Secondary | ICD-10-CM | POA: Diagnosis not present

## 2017-07-29 DIAGNOSIS — G259 Extrapyramidal and movement disorder, unspecified: Secondary | ICD-10-CM | POA: Diagnosis not present

## 2017-07-30 DIAGNOSIS — N319 Neuromuscular dysfunction of bladder, unspecified: Secondary | ICD-10-CM | POA: Diagnosis not present

## 2017-07-30 DIAGNOSIS — R339 Retention of urine, unspecified: Secondary | ICD-10-CM | POA: Diagnosis not present

## 2017-07-30 DIAGNOSIS — I10 Essential (primary) hypertension: Secondary | ICD-10-CM | POA: Diagnosis not present

## 2017-08-01 DIAGNOSIS — N183 Chronic kidney disease, stage 3 (moderate): Secondary | ICD-10-CM | POA: Diagnosis not present

## 2017-08-01 DIAGNOSIS — F329 Major depressive disorder, single episode, unspecified: Secondary | ICD-10-CM | POA: Diagnosis not present

## 2017-08-01 DIAGNOSIS — J181 Lobar pneumonia, unspecified organism: Secondary | ICD-10-CM | POA: Diagnosis not present

## 2017-08-01 DIAGNOSIS — G40909 Epilepsy, unspecified, not intractable, without status epilepticus: Secondary | ICD-10-CM | POA: Diagnosis not present

## 2017-08-01 DIAGNOSIS — R509 Fever, unspecified: Secondary | ICD-10-CM | POA: Diagnosis not present

## 2017-08-01 DIAGNOSIS — E039 Hypothyroidism, unspecified: Secondary | ICD-10-CM | POA: Diagnosis not present

## 2017-08-01 DIAGNOSIS — K219 Gastro-esophageal reflux disease without esophagitis: Secondary | ICD-10-CM | POA: Diagnosis not present

## 2017-08-01 DIAGNOSIS — Z23 Encounter for immunization: Secondary | ICD-10-CM | POA: Diagnosis not present

## 2017-08-01 DIAGNOSIS — Z8701 Personal history of pneumonia (recurrent): Secondary | ICD-10-CM | POA: Diagnosis not present

## 2017-08-01 DIAGNOSIS — R05 Cough: Secondary | ICD-10-CM | POA: Diagnosis not present

## 2017-08-01 DIAGNOSIS — I129 Hypertensive chronic kidney disease with stage 1 through stage 4 chronic kidney disease, or unspecified chronic kidney disease: Secondary | ICD-10-CM | POA: Diagnosis not present

## 2017-08-01 DIAGNOSIS — Z66 Do not resuscitate: Secondary | ICD-10-CM | POA: Diagnosis not present

## 2017-08-01 DIAGNOSIS — Z888 Allergy status to other drugs, medicaments and biological substances status: Secondary | ICD-10-CM | POA: Diagnosis not present

## 2017-08-01 DIAGNOSIS — F209 Schizophrenia, unspecified: Secondary | ICD-10-CM | POA: Diagnosis not present

## 2017-08-01 DIAGNOSIS — Z79899 Other long term (current) drug therapy: Secondary | ICD-10-CM | POA: Diagnosis not present

## 2017-08-01 DIAGNOSIS — M1990 Unspecified osteoarthritis, unspecified site: Secondary | ICD-10-CM | POA: Diagnosis not present

## 2017-08-01 DIAGNOSIS — F0390 Unspecified dementia without behavioral disturbance: Secondary | ICD-10-CM | POA: Diagnosis not present

## 2017-08-02 DIAGNOSIS — Z66 Do not resuscitate: Secondary | ICD-10-CM | POA: Diagnosis not present

## 2017-08-02 DIAGNOSIS — F0390 Unspecified dementia without behavioral disturbance: Secondary | ICD-10-CM | POA: Diagnosis not present

## 2017-08-02 DIAGNOSIS — I129 Hypertensive chronic kidney disease with stage 1 through stage 4 chronic kidney disease, or unspecified chronic kidney disease: Secondary | ICD-10-CM | POA: Diagnosis not present

## 2017-08-02 DIAGNOSIS — F209 Schizophrenia, unspecified: Secondary | ICD-10-CM | POA: Diagnosis not present

## 2017-08-02 DIAGNOSIS — R05 Cough: Secondary | ICD-10-CM | POA: Diagnosis not present

## 2017-08-02 DIAGNOSIS — J181 Lobar pneumonia, unspecified organism: Secondary | ICD-10-CM | POA: Diagnosis not present

## 2017-08-02 DIAGNOSIS — G40909 Epilepsy, unspecified, not intractable, without status epilepticus: Secondary | ICD-10-CM | POA: Diagnosis not present

## 2017-08-02 DIAGNOSIS — E039 Hypothyroidism, unspecified: Secondary | ICD-10-CM | POA: Diagnosis not present

## 2017-08-02 DIAGNOSIS — N183 Chronic kidney disease, stage 3 (moderate): Secondary | ICD-10-CM | POA: Diagnosis not present

## 2017-08-04 ENCOUNTER — Other Ambulatory Visit: Payer: Self-pay

## 2017-08-04 DIAGNOSIS — D649 Anemia, unspecified: Secondary | ICD-10-CM | POA: Diagnosis not present

## 2017-08-04 DIAGNOSIS — J181 Lobar pneumonia, unspecified organism: Secondary | ICD-10-CM | POA: Diagnosis not present

## 2017-08-04 DIAGNOSIS — G40909 Epilepsy, unspecified, not intractable, without status epilepticus: Secondary | ICD-10-CM | POA: Diagnosis not present

## 2017-08-04 DIAGNOSIS — Z466 Encounter for fitting and adjustment of urinary device: Secondary | ICD-10-CM | POA: Diagnosis not present

## 2017-08-04 DIAGNOSIS — R339 Retention of urine, unspecified: Secondary | ICD-10-CM | POA: Diagnosis not present

## 2017-08-04 DIAGNOSIS — M1991 Primary osteoarthritis, unspecified site: Secondary | ICD-10-CM | POA: Diagnosis not present

## 2017-08-04 DIAGNOSIS — E039 Hypothyroidism, unspecified: Secondary | ICD-10-CM | POA: Diagnosis not present

## 2017-08-04 DIAGNOSIS — N183 Chronic kidney disease, stage 3 (moderate): Secondary | ICD-10-CM | POA: Diagnosis not present

## 2017-08-04 DIAGNOSIS — N319 Neuromuscular dysfunction of bladder, unspecified: Secondary | ICD-10-CM | POA: Diagnosis not present

## 2017-08-04 DIAGNOSIS — Z9181 History of falling: Secondary | ICD-10-CM | POA: Diagnosis not present

## 2017-08-04 DIAGNOSIS — F209 Schizophrenia, unspecified: Secondary | ICD-10-CM | POA: Diagnosis not present

## 2017-08-04 DIAGNOSIS — F0390 Unspecified dementia without behavioral disturbance: Secondary | ICD-10-CM | POA: Diagnosis not present

## 2017-08-04 DIAGNOSIS — M24412 Recurrent dislocation, left shoulder: Secondary | ICD-10-CM | POA: Diagnosis not present

## 2017-08-04 DIAGNOSIS — I129 Hypertensive chronic kidney disease with stage 1 through stage 4 chronic kidney disease, or unspecified chronic kidney disease: Secondary | ICD-10-CM | POA: Diagnosis not present

## 2017-08-04 DIAGNOSIS — F329 Major depressive disorder, single episode, unspecified: Secondary | ICD-10-CM | POA: Diagnosis not present

## 2017-08-04 NOTE — Patient Outreach (Signed)
[place] [facility]) Care Management  08/04/2017  NIKIA MANGINO 01/01/1941 [id number]   Referral Date: 08/04/17 Referral Source:  HTA Report Date of Admission: ? Diagnosis: Lobar Pneumonia Date of Discharge: 08/03/17 Facility:  [place]: HTA  Outreach attempt # 1 Telephone call to patient preferred number [phone].  No answer. Unable to leave a message.  Called other number [phone]. Number disconnected.     Plan: RN CM will send letter and attempt within 4 business days.   Jone Baseman, RN, MSN [facility] Care Management Care Management Coordinator Direct Line [phone] Toll Free: [phone]  Fax: [phone]

## 2017-08-05 ENCOUNTER — Other Ambulatory Visit: Payer: Self-pay

## 2017-08-05 DIAGNOSIS — J189 Pneumonia, unspecified organism: Secondary | ICD-10-CM | POA: Diagnosis not present

## 2017-08-05 DIAGNOSIS — Z79899 Other long term (current) drug therapy: Secondary | ICD-10-CM | POA: Diagnosis not present

## 2017-08-05 DIAGNOSIS — D518 Other vitamin B12 deficiency anemias: Secondary | ICD-10-CM | POA: Diagnosis not present

## 2017-08-05 DIAGNOSIS — E119 Type 2 diabetes mellitus without complications: Secondary | ICD-10-CM | POA: Diagnosis not present

## 2017-08-05 DIAGNOSIS — E7849 Other hyperlipidemia: Secondary | ICD-10-CM | POA: Diagnosis not present

## 2017-08-05 NOTE — Patient Outreach (Signed)
[facility] [facility]) Care Management  08/05/2017  JERMARIO KALMAR 1940/10/03 [id number]   Referral Date: 08/04/17 Referral Source: HTA Report Date of Admission: 07/31/17 Diagnosis: Pneumonia Date of Discharge: 08/03/17 Facility:  [place]:  HTA  Outreach attempt # 2 Telephone call to patient.  Patient inpatient at [facility] in [place].  Spoke with Mariann Laster who is caregiver there.  She acknowledges that patient is a resident there and that he was in the hospital. She states that patient is ok since being back.  However, she acknowledges he has a cough.   She states she has been in contact with his primary care physician and that he will have a repeat xray today.  Asked about a follow up appointment with his primary care physician.  She states he has an appointment next week.  She declines that patient has any needs at this time and acknowledges that the facility provides all care for patient.     Plan: RN CM will close case and notify care management assistant of case status.    Jone Baseman, RN, MSN [facility] Care Management Care Management Coordinator Direct Line [phone] Toll Free: [phone]  Fax: [phone]

## 2017-08-09 DIAGNOSIS — E7849 Other hyperlipidemia: Secondary | ICD-10-CM | POA: Diagnosis not present

## 2017-08-09 DIAGNOSIS — D518 Other vitamin B12 deficiency anemias: Secondary | ICD-10-CM | POA: Diagnosis not present

## 2017-08-09 DIAGNOSIS — E038 Other specified hypothyroidism: Secondary | ICD-10-CM | POA: Diagnosis not present

## 2017-08-09 DIAGNOSIS — Z79899 Other long term (current) drug therapy: Secondary | ICD-10-CM | POA: Diagnosis not present

## 2017-08-09 DIAGNOSIS — E119 Type 2 diabetes mellitus without complications: Secondary | ICD-10-CM | POA: Diagnosis not present

## 2017-08-11 DIAGNOSIS — E871 Hypo-osmolality and hyponatremia: Secondary | ICD-10-CM | POA: Diagnosis not present

## 2017-08-11 DIAGNOSIS — S2243XD Multiple fractures of ribs, bilateral, subsequent encounter for fracture with routine healing: Secondary | ICD-10-CM | POA: Diagnosis not present

## 2017-08-11 DIAGNOSIS — J181 Lobar pneumonia, unspecified organism: Secondary | ICD-10-CM | POA: Diagnosis not present

## 2017-08-11 DIAGNOSIS — N189 Chronic kidney disease, unspecified: Secondary | ICD-10-CM | POA: Diagnosis not present

## 2017-08-16 DIAGNOSIS — T83511A Infection and inflammatory reaction due to indwelling urethral catheter, initial encounter: Secondary | ICD-10-CM | POA: Diagnosis not present

## 2017-08-16 DIAGNOSIS — S3994XA Unspecified injury of external genitals, initial encounter: Secondary | ICD-10-CM | POA: Diagnosis not present

## 2017-08-16 DIAGNOSIS — R4182 Altered mental status, unspecified: Secondary | ICD-10-CM | POA: Diagnosis not present

## 2017-08-16 DIAGNOSIS — T83021A Displacement of indwelling urethral catheter, initial encounter: Secondary | ICD-10-CM | POA: Diagnosis not present

## 2017-08-16 DIAGNOSIS — Z79899 Other long term (current) drug therapy: Secondary | ICD-10-CM | POA: Diagnosis not present

## 2017-08-16 DIAGNOSIS — N390 Urinary tract infection, site not specified: Secondary | ICD-10-CM | POA: Diagnosis not present

## 2017-08-16 DIAGNOSIS — I10 Essential (primary) hypertension: Secondary | ICD-10-CM | POA: Diagnosis not present

## 2017-08-16 DIAGNOSIS — F0390 Unspecified dementia without behavioral disturbance: Secondary | ICD-10-CM | POA: Diagnosis not present

## 2017-08-16 DIAGNOSIS — E038 Other specified hypothyroidism: Secondary | ICD-10-CM | POA: Diagnosis not present

## 2017-08-23 DIAGNOSIS — E7849 Other hyperlipidemia: Secondary | ICD-10-CM | POA: Diagnosis not present

## 2017-08-23 DIAGNOSIS — Z79899 Other long term (current) drug therapy: Secondary | ICD-10-CM | POA: Diagnosis not present

## 2017-08-23 DIAGNOSIS — E119 Type 2 diabetes mellitus without complications: Secondary | ICD-10-CM | POA: Diagnosis not present

## 2017-08-23 DIAGNOSIS — D518 Other vitamin B12 deficiency anemias: Secondary | ICD-10-CM | POA: Diagnosis not present

## 2017-08-25 DIAGNOSIS — N319 Neuromuscular dysfunction of bladder, unspecified: Secondary | ICD-10-CM | POA: Diagnosis not present

## 2017-08-25 DIAGNOSIS — N3000 Acute cystitis without hematuria: Secondary | ICD-10-CM | POA: Diagnosis not present

## 2017-08-26 DIAGNOSIS — E119 Type 2 diabetes mellitus without complications: Secondary | ICD-10-CM | POA: Diagnosis not present

## 2017-08-26 DIAGNOSIS — N3090 Cystitis, unspecified without hematuria: Secondary | ICD-10-CM | POA: Diagnosis not present

## 2017-08-26 DIAGNOSIS — R509 Fever, unspecified: Secondary | ICD-10-CM | POA: Diagnosis not present

## 2017-08-30 DIAGNOSIS — J189 Pneumonia, unspecified organism: Secondary | ICD-10-CM | POA: Diagnosis not present

## 2017-09-01 DIAGNOSIS — N3000 Acute cystitis without hematuria: Secondary | ICD-10-CM | POA: Diagnosis not present

## 2017-09-01 DIAGNOSIS — J181 Lobar pneumonia, unspecified organism: Secondary | ICD-10-CM | POA: Diagnosis not present

## 2017-09-01 DIAGNOSIS — M25551 Pain in right hip: Secondary | ICD-10-CM | POA: Diagnosis not present

## 2017-09-01 DIAGNOSIS — M79651 Pain in right thigh: Secondary | ICD-10-CM | POA: Diagnosis not present

## 2017-09-01 DIAGNOSIS — R102 Pelvic and perineal pain: Secondary | ICD-10-CM | POA: Diagnosis not present

## 2017-09-02 DIAGNOSIS — M25561 Pain in right knee: Secondary | ICD-10-CM | POA: Diagnosis not present

## 2017-09-02 DIAGNOSIS — M79651 Pain in right thigh: Secondary | ICD-10-CM | POA: Diagnosis not present

## 2017-09-02 DIAGNOSIS — M25551 Pain in right hip: Secondary | ICD-10-CM | POA: Diagnosis not present

## 2017-09-03 ENCOUNTER — Ambulatory Visit: Payer: PPO | Admitting: Sports Medicine

## 2017-09-16 DIAGNOSIS — F200 Paranoid schizophrenia: Secondary | ICD-10-CM | POA: Diagnosis not present

## 2017-09-16 DIAGNOSIS — G259 Extrapyramidal and movement disorder, unspecified: Secondary | ICD-10-CM | POA: Diagnosis not present

## 2017-09-16 DIAGNOSIS — F411 Generalized anxiety disorder: Secondary | ICD-10-CM | POA: Diagnosis not present

## 2017-09-28 DIAGNOSIS — E7849 Other hyperlipidemia: Secondary | ICD-10-CM | POA: Diagnosis not present

## 2017-09-28 DIAGNOSIS — E038 Other specified hypothyroidism: Secondary | ICD-10-CM | POA: Diagnosis not present

## 2017-09-28 DIAGNOSIS — D518 Other vitamin B12 deficiency anemias: Secondary | ICD-10-CM | POA: Diagnosis not present

## 2017-09-28 DIAGNOSIS — E119 Type 2 diabetes mellitus without complications: Secondary | ICD-10-CM | POA: Diagnosis not present

## 2017-09-28 DIAGNOSIS — E559 Vitamin D deficiency, unspecified: Secondary | ICD-10-CM | POA: Diagnosis not present

## 2017-09-28 DIAGNOSIS — Z79899 Other long term (current) drug therapy: Secondary | ICD-10-CM | POA: Diagnosis not present

## 2017-09-29 DIAGNOSIS — R569 Unspecified convulsions: Secondary | ICD-10-CM | POA: Diagnosis not present

## 2017-09-29 DIAGNOSIS — E039 Hypothyroidism, unspecified: Secondary | ICD-10-CM | POA: Diagnosis not present

## 2017-09-29 DIAGNOSIS — N319 Neuromuscular dysfunction of bladder, unspecified: Secondary | ICD-10-CM | POA: Diagnosis not present

## 2017-09-29 DIAGNOSIS — E871 Hypo-osmolality and hyponatremia: Secondary | ICD-10-CM | POA: Diagnosis not present

## 2017-10-03 DIAGNOSIS — D649 Anemia, unspecified: Secondary | ICD-10-CM | POA: Diagnosis not present

## 2017-10-03 DIAGNOSIS — N183 Chronic kidney disease, stage 3 (moderate): Secondary | ICD-10-CM | POA: Diagnosis not present

## 2017-10-03 DIAGNOSIS — I129 Hypertensive chronic kidney disease with stage 1 through stage 4 chronic kidney disease, or unspecified chronic kidney disease: Secondary | ICD-10-CM | POA: Diagnosis not present

## 2017-10-03 DIAGNOSIS — E039 Hypothyroidism, unspecified: Secondary | ICD-10-CM | POA: Diagnosis not present

## 2017-10-03 DIAGNOSIS — M1991 Primary osteoarthritis, unspecified site: Secondary | ICD-10-CM | POA: Diagnosis not present

## 2017-10-03 DIAGNOSIS — M24412 Recurrent dislocation, left shoulder: Secondary | ICD-10-CM | POA: Diagnosis not present

## 2017-10-03 DIAGNOSIS — Z9181 History of falling: Secondary | ICD-10-CM | POA: Diagnosis not present

## 2017-10-03 DIAGNOSIS — F0390 Unspecified dementia without behavioral disturbance: Secondary | ICD-10-CM | POA: Diagnosis not present

## 2017-10-03 DIAGNOSIS — F329 Major depressive disorder, single episode, unspecified: Secondary | ICD-10-CM | POA: Diagnosis not present

## 2017-10-03 DIAGNOSIS — G40909 Epilepsy, unspecified, not intractable, without status epilepticus: Secondary | ICD-10-CM | POA: Diagnosis not present

## 2017-10-03 DIAGNOSIS — F209 Schizophrenia, unspecified: Secondary | ICD-10-CM | POA: Diagnosis not present

## 2017-10-03 DIAGNOSIS — Z466 Encounter for fitting and adjustment of urinary device: Secondary | ICD-10-CM | POA: Diagnosis not present

## 2017-10-03 DIAGNOSIS — N319 Neuromuscular dysfunction of bladder, unspecified: Secondary | ICD-10-CM | POA: Diagnosis not present

## 2017-10-03 DIAGNOSIS — R339 Retention of urine, unspecified: Secondary | ICD-10-CM | POA: Diagnosis not present

## 2017-10-15 DIAGNOSIS — N319 Neuromuscular dysfunction of bladder, unspecified: Secondary | ICD-10-CM | POA: Diagnosis not present

## 2017-10-15 DIAGNOSIS — Z466 Encounter for fitting and adjustment of urinary device: Secondary | ICD-10-CM | POA: Diagnosis not present

## 2017-10-21 DIAGNOSIS — G259 Extrapyramidal and movement disorder, unspecified: Secondary | ICD-10-CM | POA: Diagnosis not present

## 2017-10-21 DIAGNOSIS — G4700 Insomnia, unspecified: Secondary | ICD-10-CM | POA: Diagnosis not present

## 2017-10-21 DIAGNOSIS — F200 Paranoid schizophrenia: Secondary | ICD-10-CM | POA: Diagnosis not present

## 2017-10-21 DIAGNOSIS — F411 Generalized anxiety disorder: Secondary | ICD-10-CM | POA: Diagnosis not present

## 2017-10-27 DIAGNOSIS — E039 Hypothyroidism, unspecified: Secondary | ICD-10-CM | POA: Diagnosis not present

## 2017-10-27 DIAGNOSIS — I10 Essential (primary) hypertension: Secondary | ICD-10-CM | POA: Diagnosis not present

## 2017-10-27 DIAGNOSIS — N319 Neuromuscular dysfunction of bladder, unspecified: Secondary | ICD-10-CM | POA: Diagnosis not present

## 2017-10-27 DIAGNOSIS — G40909 Epilepsy, unspecified, not intractable, without status epilepticus: Secondary | ICD-10-CM | POA: Diagnosis not present

## 2017-11-04 DIAGNOSIS — I739 Peripheral vascular disease, unspecified: Secondary | ICD-10-CM | POA: Diagnosis not present

## 2017-11-04 DIAGNOSIS — L84 Corns and callosities: Secondary | ICD-10-CM | POA: Diagnosis not present

## 2017-11-04 DIAGNOSIS — B351 Tinea unguium: Secondary | ICD-10-CM | POA: Diagnosis not present

## 2017-11-15 DIAGNOSIS — R05 Cough: Secondary | ICD-10-CM | POA: Diagnosis not present

## 2017-11-15 DIAGNOSIS — R062 Wheezing: Secondary | ICD-10-CM | POA: Diagnosis not present

## 2017-11-24 DIAGNOSIS — I10 Essential (primary) hypertension: Secondary | ICD-10-CM | POA: Diagnosis not present

## 2017-11-24 DIAGNOSIS — G40909 Epilepsy, unspecified, not intractable, without status epilepticus: Secondary | ICD-10-CM | POA: Diagnosis not present

## 2017-11-24 DIAGNOSIS — N319 Neuromuscular dysfunction of bladder, unspecified: Secondary | ICD-10-CM | POA: Diagnosis not present

## 2017-11-24 DIAGNOSIS — N189 Chronic kidney disease, unspecified: Secondary | ICD-10-CM | POA: Diagnosis not present

## 2017-11-26 DIAGNOSIS — F0390 Unspecified dementia without behavioral disturbance: Secondary | ICD-10-CM | POA: Diagnosis not present

## 2017-11-26 DIAGNOSIS — R079 Chest pain, unspecified: Secondary | ICD-10-CM | POA: Diagnosis not present

## 2017-11-26 DIAGNOSIS — I10 Essential (primary) hypertension: Secondary | ICD-10-CM | POA: Diagnosis not present

## 2017-11-26 DIAGNOSIS — R0789 Other chest pain: Secondary | ICD-10-CM | POA: Diagnosis not present

## 2017-12-01 IMAGING — DX DG SHOULDER 1V*L*
2 series · 2 of 2 positions shown · non-contrast
Comparison: Radiograph 11/05/2016

CLINICAL DATA: Post unwitnessed fall.  Question dislocation.

EXAM:
LEFT SHOULDER - 1 VIEW

[shoulder ap]
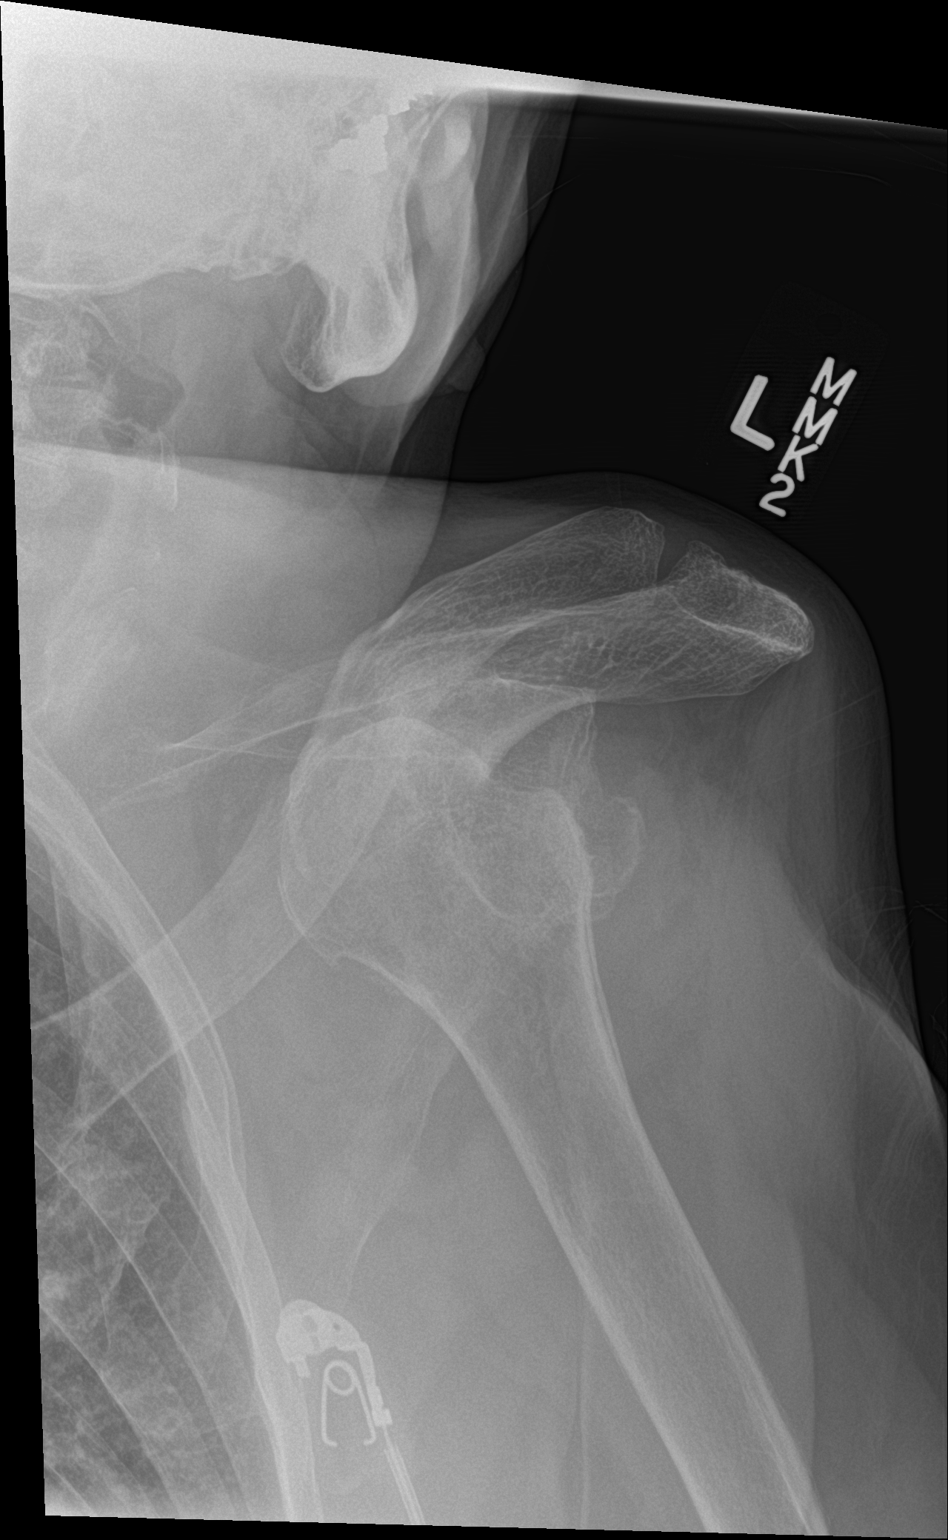

[shoulder obl]
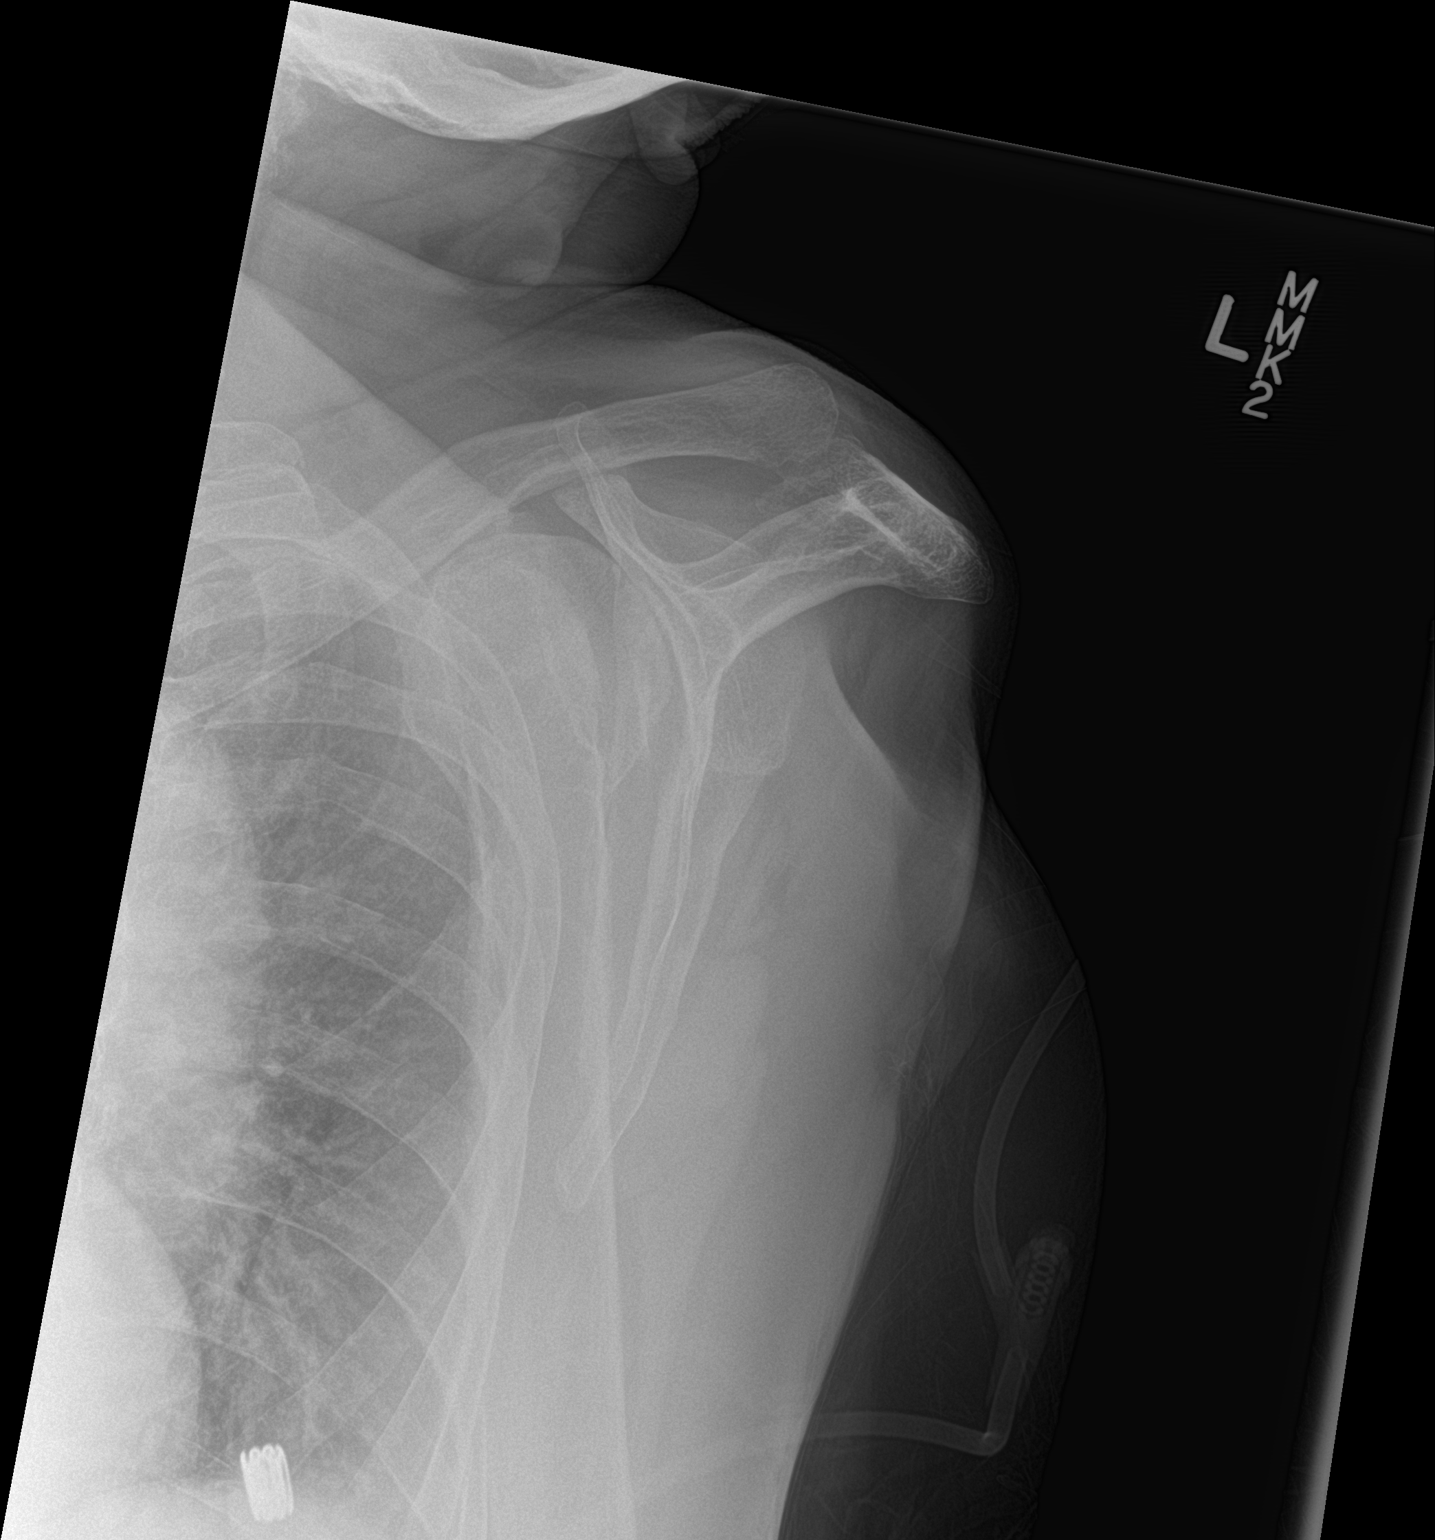

[2 of 2 positions shown; findings below may reference images not displayed]

FINDINGS: Chronic anterior left shoulder dislocation. Alignment is unchanged
from prior exam. Large Hill-Sachs impaction injury to the lateral
humeral head is unchanged. Glenoid appear shallow. Acromioclavicular
joint is congruent.
IMPRESSION: Chronic anterior left shoulder dislocation with Hill-Sachs
deformity. Alignment is unchanged from exam 3 months prior.

## 2017-12-01 IMAGING — DX DG CHEST 1V PORT
1 series · 1 of 1 positions shown · non-contrast
Comparison: Chest radiograph 11/05/2016

CLINICAL DATA: Unwitnessed fall.

EXAM:
PORTABLE CHEST 1 VIEW

[chest ap]
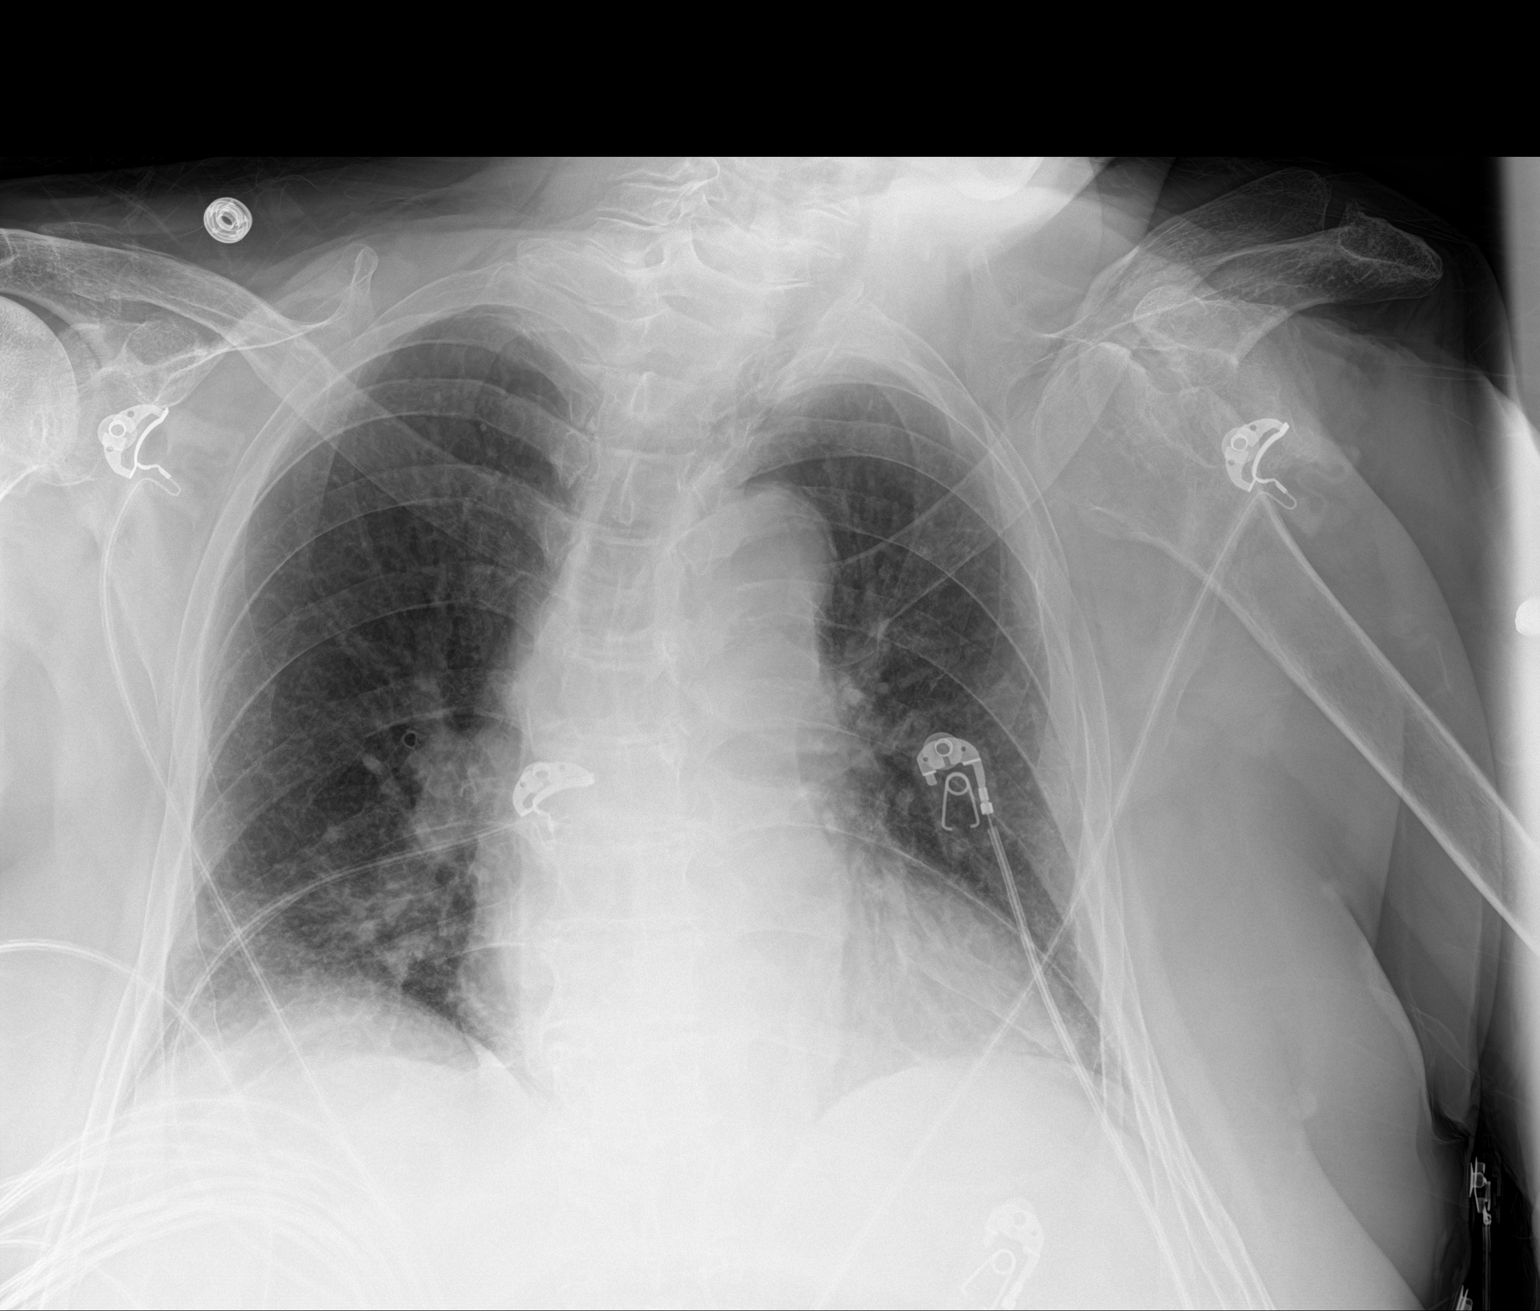

[1 of 1 positions shown; findings below may reference images not displayed]

FINDINGS: Unchanged cardiomegaly and mediastinal contours. Mild bibasilar
atelectasis. No pulmonary edema, pleural effusion or pneumothorax.
No confluent airspace disease. Chronic left shoulder dislocation.
IMPRESSION: Bibasilar atelectasis.  Stable mild cardiomegaly.

## 2017-12-01 IMAGING — CT CT HEAD W/O CM
4 of 8 series · 16 of 47 positions shown, 17 images · non-contrast
Comparison: Head and cervical spine CT 06/03/2016

CLINICAL DATA: Unwitnessed fall today striking head.

EXAM:
CT HEAD WITHOUT CONTRAST
CT CERVICAL SPINE WITHOUT CONTRAST
TECHNIQUE: Multidetector CT imaging of the head and cervical spine was
performed following the standard protocol without intravenous
contrast. Multiplanar CT image reconstructions of the cervical spine
were also generated.

[Series 4: head wo · axial · 0.43mm/px · z∈[-112,-27]mm · 3 of 35 slices shown, 4 images]
[im 9/35  brain]
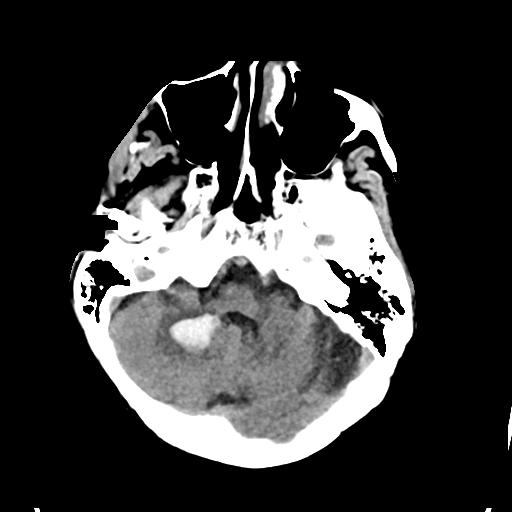
[im 9/35  bone]
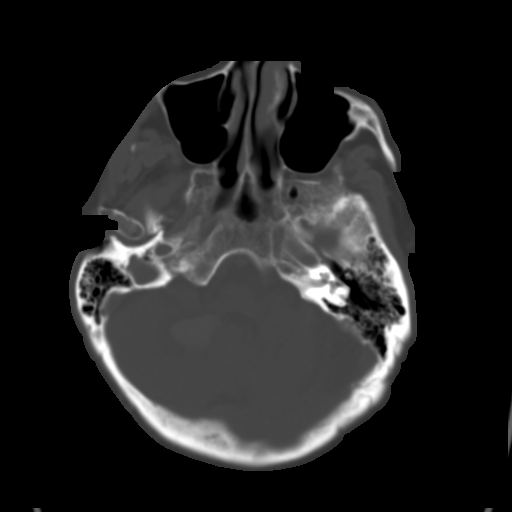
[im 18/35  brain]
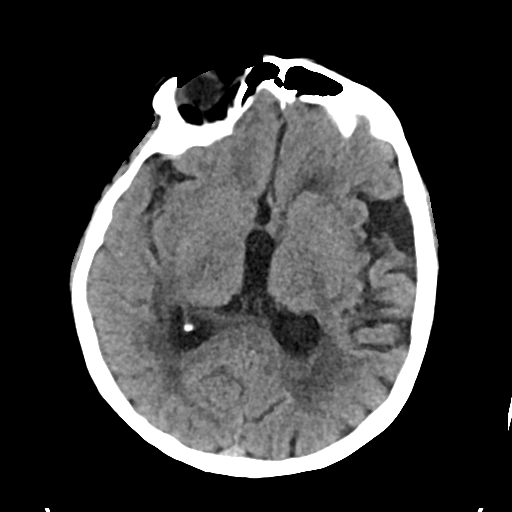
[im 26/35  brain]
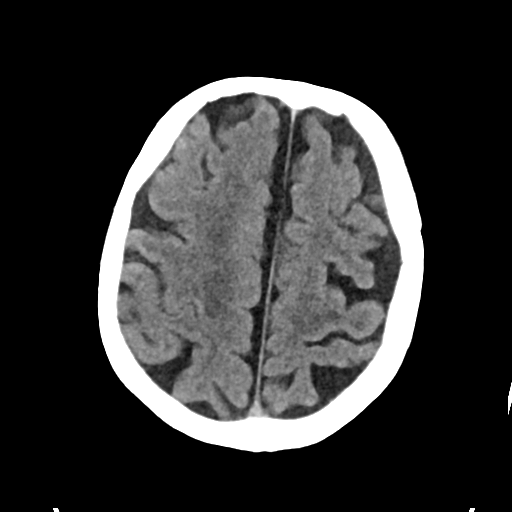

[Series 5: head bone · axial · 0.43mm/px · z∈[-138,+6]mm · 8 of 88 slices shown]
[im 8/88  bone]
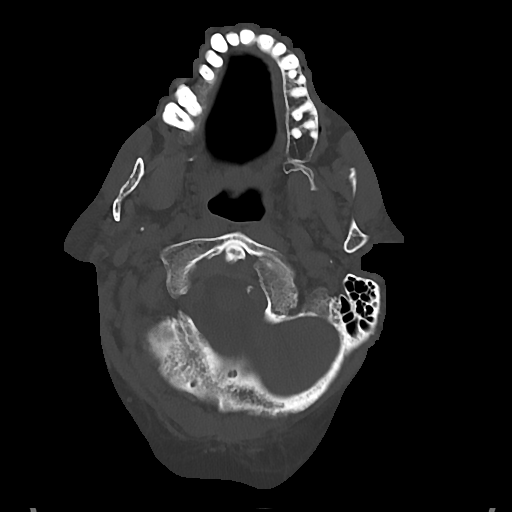
[im 22/88  bone]
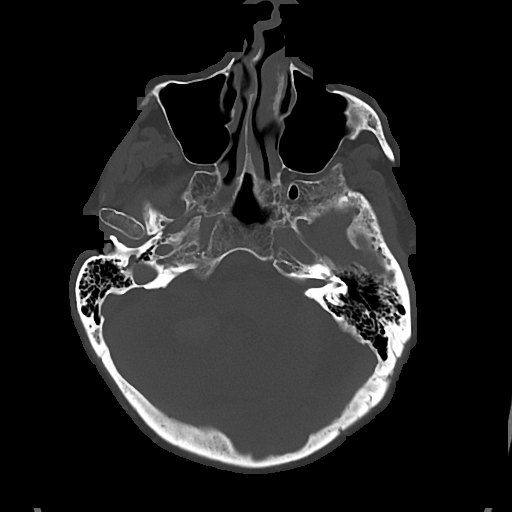
[im 30/88  bone]
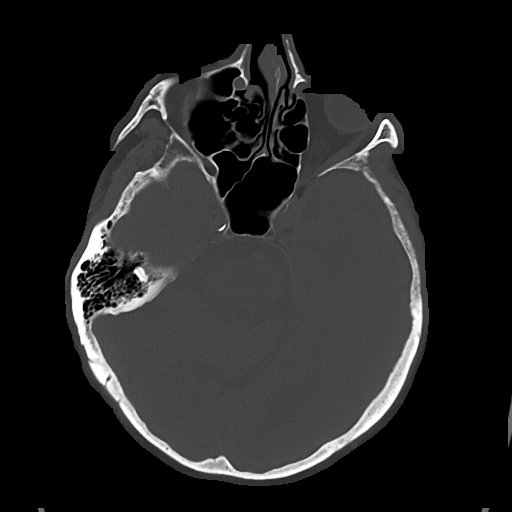
[im 37/88  bone]
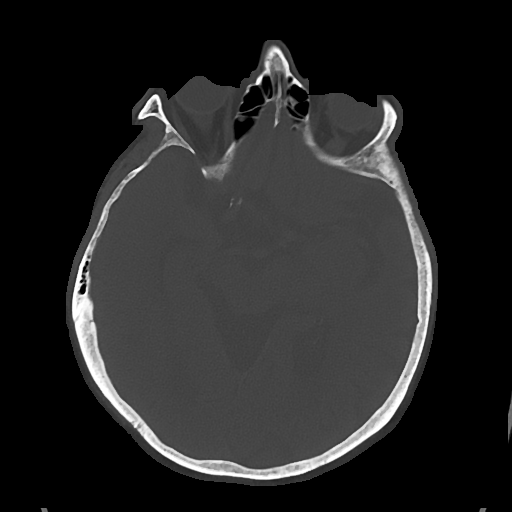
[im 51/88  bone]
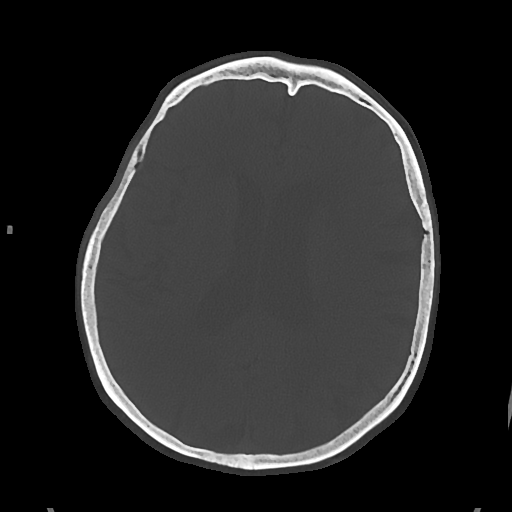
[im 59/88  bone]
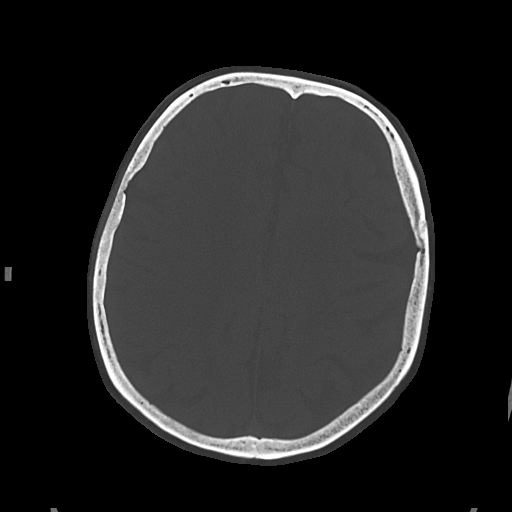
[im 66/88  bone]
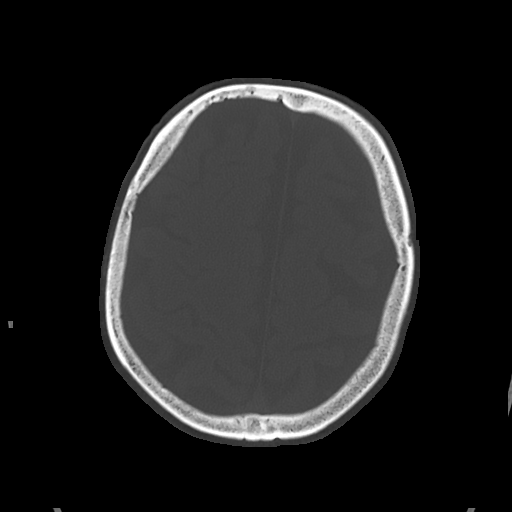
[im 80/88  bone]
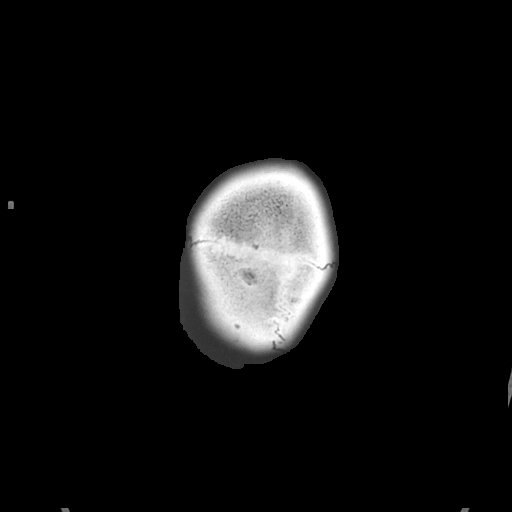

[Series 6: cor soft · coronal · 0.34mm/px · 3 of 72 slices shown]
[im 18/72  brain]
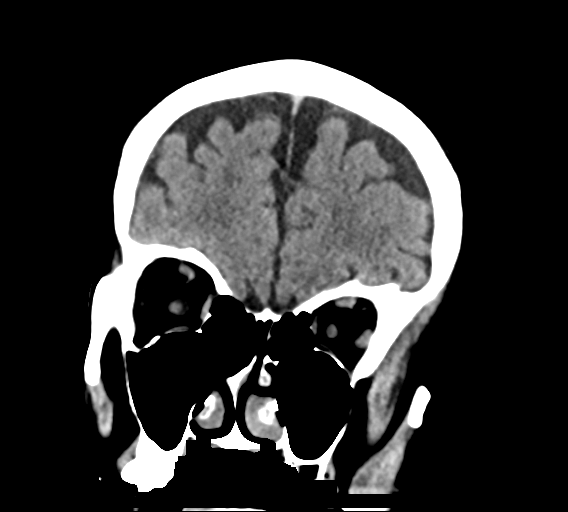
[im 36/72  brain]
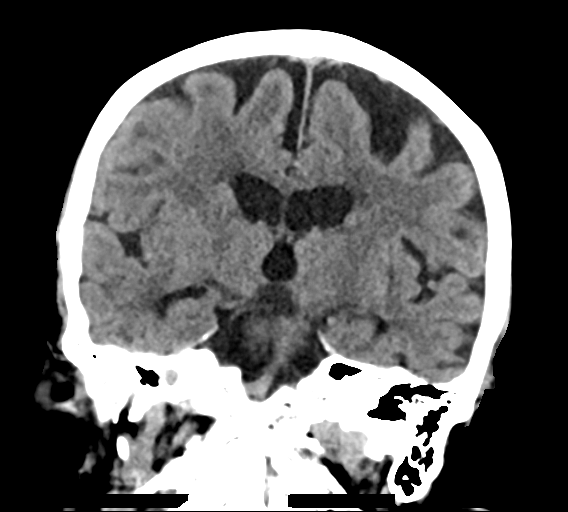
[im 54/72  brain]
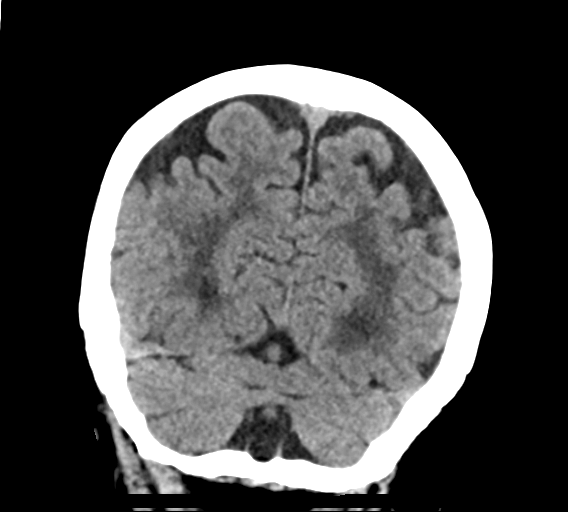

[Series 7: sag soft · sagittal · 0.34mm/px · 2 of 63 slices shown]
[im 21/63  brain]
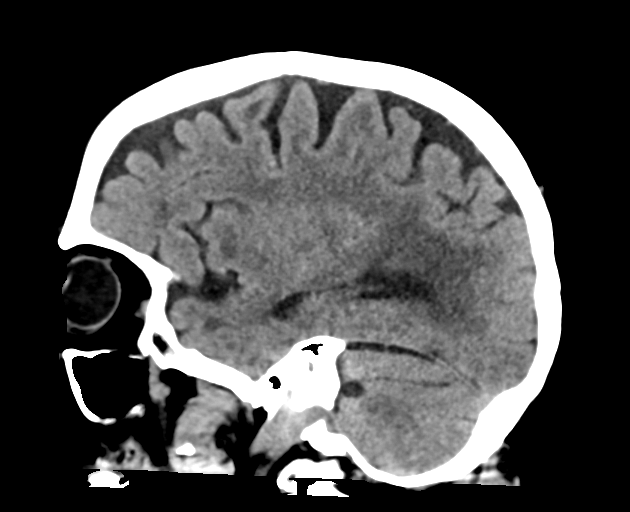
[im 42/63  brain]
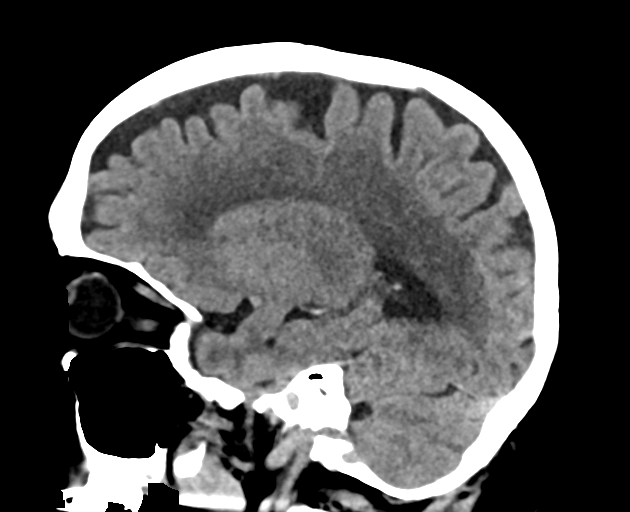

[16 of 47 positions shown; findings below may reference images not displayed]

FINDINGS: CT HEAD FINDINGS

Brain: Right posterior fossa hemorrhage measures 18 x 18 mm, this is
likely in the subarachnoid space with minimal intraventricular
hemorrhage layering in the posterior horn of the left lateral
ventricle. Mild associated cerebellar edema. No developing
hydrocephalus, basilar cisterns are patent. There is generalized
atrophy and chronic small vessel ischemia. No subdural hematoma.

Vascular: Atherosclerosis of skullbase vasculature without
hyperdense vessel or abnormal calcification.

Skull: No fracture or focal lesion.

Sinuses/Orbits: Remote nasal bone fracture with leftward nasal
deviation. Left cataract resection. No acute finding.

Other: None.

CT CERVICAL SPINE FINDINGS

Alignment: Exaggerated cervical lordosis.  No traumatic listhesis.

Skull base and vertebrae: Mild loss of height superior endplate of
C7 with fragmentation has progressed from prior exam but does not
appear acute. Remaining vertebral body heights are preserved.
Sclerotic lesion known right C7 pedicle is unchanged and likely a
bone island. The dens and skull base are intact.

Soft tissues and spinal canal: No prevertebral fluid or swelling. No
visible canal hematoma.

Disc levels: Disc space narrowing and endplate spurring at C4-C5 and
C5-C6.

Upper chest: No acute abnormality.

Other: None.
IMPRESSION: 1. Right 18 mm cerebellar hemorrhage, likely subarachnoid. Minimal
adjacent edema. Minimal layering intraventricular hemorrhage in the
temple of the left lateral ventricle No mass effect.
2. Increased loss of height of C7 vertebral body with fragmentation
of the superior endplate, progressed from prior exam but appears
chronic. No evidence of acute cervical spine fracture.
Critical Value/emergent results were called by telephone at the time
of interpretation on 02/19/2017 at [DATE] to Dr. SONIDA OGGIANO , who
verbally acknowledged these results.

## 2017-12-02 DIAGNOSIS — G4700 Insomnia, unspecified: Secondary | ICD-10-CM | POA: Diagnosis not present

## 2017-12-02 DIAGNOSIS — M1991 Primary osteoarthritis, unspecified site: Secondary | ICD-10-CM | POA: Diagnosis not present

## 2017-12-02 DIAGNOSIS — N183 Chronic kidney disease, stage 3 (moderate): Secondary | ICD-10-CM | POA: Diagnosis not present

## 2017-12-02 DIAGNOSIS — I129 Hypertensive chronic kidney disease with stage 1 through stage 4 chronic kidney disease, or unspecified chronic kidney disease: Secondary | ICD-10-CM | POA: Diagnosis not present

## 2017-12-02 DIAGNOSIS — G259 Extrapyramidal and movement disorder, unspecified: Secondary | ICD-10-CM | POA: Diagnosis not present

## 2017-12-02 DIAGNOSIS — R339 Retention of urine, unspecified: Secondary | ICD-10-CM | POA: Diagnosis not present

## 2017-12-02 DIAGNOSIS — M24412 Recurrent dislocation, left shoulder: Secondary | ICD-10-CM | POA: Diagnosis not present

## 2017-12-02 DIAGNOSIS — N319 Neuromuscular dysfunction of bladder, unspecified: Secondary | ICD-10-CM | POA: Diagnosis not present

## 2017-12-02 DIAGNOSIS — F209 Schizophrenia, unspecified: Secondary | ICD-10-CM | POA: Diagnosis not present

## 2017-12-02 DIAGNOSIS — D649 Anemia, unspecified: Secondary | ICD-10-CM | POA: Diagnosis not present

## 2017-12-02 DIAGNOSIS — E039 Hypothyroidism, unspecified: Secondary | ICD-10-CM | POA: Diagnosis not present

## 2017-12-02 DIAGNOSIS — F411 Generalized anxiety disorder: Secondary | ICD-10-CM | POA: Diagnosis not present

## 2017-12-02 DIAGNOSIS — F0390 Unspecified dementia without behavioral disturbance: Secondary | ICD-10-CM | POA: Diagnosis not present

## 2017-12-02 DIAGNOSIS — Z466 Encounter for fitting and adjustment of urinary device: Secondary | ICD-10-CM | POA: Diagnosis not present

## 2017-12-02 DIAGNOSIS — G40909 Epilepsy, unspecified, not intractable, without status epilepticus: Secondary | ICD-10-CM | POA: Diagnosis not present

## 2017-12-02 DIAGNOSIS — F200 Paranoid schizophrenia: Secondary | ICD-10-CM | POA: Diagnosis not present

## 2017-12-02 DIAGNOSIS — F329 Major depressive disorder, single episode, unspecified: Secondary | ICD-10-CM | POA: Diagnosis not present

## 2017-12-02 DIAGNOSIS — Z9181 History of falling: Secondary | ICD-10-CM | POA: Diagnosis not present

## 2017-12-08 DIAGNOSIS — R0789 Other chest pain: Secondary | ICD-10-CM | POA: Diagnosis not present

## 2017-12-13 DIAGNOSIS — Z466 Encounter for fitting and adjustment of urinary device: Secondary | ICD-10-CM | POA: Diagnosis not present

## 2017-12-13 DIAGNOSIS — N319 Neuromuscular dysfunction of bladder, unspecified: Secondary | ICD-10-CM | POA: Diagnosis not present

## 2017-12-13 DIAGNOSIS — R339 Retention of urine, unspecified: Secondary | ICD-10-CM | POA: Diagnosis not present

## 2017-12-13 DIAGNOSIS — F0390 Unspecified dementia without behavioral disturbance: Secondary | ICD-10-CM | POA: Diagnosis not present

## 2017-12-15 DIAGNOSIS — H1013 Acute atopic conjunctivitis, bilateral: Secondary | ICD-10-CM | POA: Diagnosis not present

## 2017-12-16 DIAGNOSIS — F0391 Unspecified dementia with behavioral disturbance: Secondary | ICD-10-CM | POA: Diagnosis not present

## 2017-12-16 DIAGNOSIS — F200 Paranoid schizophrenia: Secondary | ICD-10-CM | POA: Diagnosis not present

## 2017-12-16 DIAGNOSIS — F411 Generalized anxiety disorder: Secondary | ICD-10-CM | POA: Diagnosis not present

## 2017-12-16 DIAGNOSIS — G259 Extrapyramidal and movement disorder, unspecified: Secondary | ICD-10-CM | POA: Diagnosis not present

## 2017-12-28 DIAGNOSIS — D518 Other vitamin B12 deficiency anemias: Secondary | ICD-10-CM | POA: Diagnosis not present

## 2017-12-28 DIAGNOSIS — Z79899 Other long term (current) drug therapy: Secondary | ICD-10-CM | POA: Diagnosis not present

## 2017-12-28 DIAGNOSIS — E038 Other specified hypothyroidism: Secondary | ICD-10-CM | POA: Diagnosis not present

## 2017-12-28 DIAGNOSIS — E559 Vitamin D deficiency, unspecified: Secondary | ICD-10-CM | POA: Diagnosis not present

## 2017-12-28 DIAGNOSIS — E7849 Other hyperlipidemia: Secondary | ICD-10-CM | POA: Diagnosis not present

## 2017-12-28 DIAGNOSIS — E119 Type 2 diabetes mellitus without complications: Secondary | ICD-10-CM | POA: Diagnosis not present

## 2017-12-29 DIAGNOSIS — E039 Hypothyroidism, unspecified: Secondary | ICD-10-CM | POA: Diagnosis not present

## 2017-12-29 DIAGNOSIS — G40909 Epilepsy, unspecified, not intractable, without status epilepticus: Secondary | ICD-10-CM | POA: Diagnosis not present

## 2017-12-29 DIAGNOSIS — I10 Essential (primary) hypertension: Secondary | ICD-10-CM | POA: Diagnosis not present

## 2017-12-29 DIAGNOSIS — N319 Neuromuscular dysfunction of bladder, unspecified: Secondary | ICD-10-CM | POA: Diagnosis not present

## 2018-01-15 DIAGNOSIS — R062 Wheezing: Secondary | ICD-10-CM | POA: Diagnosis not present

## 2018-01-15 DIAGNOSIS — R05 Cough: Secondary | ICD-10-CM | POA: Diagnosis not present

## 2018-01-18 DIAGNOSIS — J189 Pneumonia, unspecified organism: Secondary | ICD-10-CM | POA: Diagnosis not present

## 2018-01-18 DIAGNOSIS — D518 Other vitamin B12 deficiency anemias: Secondary | ICD-10-CM | POA: Diagnosis not present

## 2018-01-18 DIAGNOSIS — Z79899 Other long term (current) drug therapy: Secondary | ICD-10-CM | POA: Diagnosis not present

## 2018-01-18 DIAGNOSIS — E119 Type 2 diabetes mellitus without complications: Secondary | ICD-10-CM | POA: Diagnosis not present

## 2018-01-18 DIAGNOSIS — E7849 Other hyperlipidemia: Secondary | ICD-10-CM | POA: Diagnosis not present

## 2018-01-19 DIAGNOSIS — J069 Acute upper respiratory infection, unspecified: Secondary | ICD-10-CM | POA: Diagnosis not present

## 2018-01-20 DIAGNOSIS — G259 Extrapyramidal and movement disorder, unspecified: Secondary | ICD-10-CM | POA: Diagnosis not present

## 2018-01-20 DIAGNOSIS — F0391 Unspecified dementia with behavioral disturbance: Secondary | ICD-10-CM | POA: Diagnosis not present

## 2018-01-20 DIAGNOSIS — F200 Paranoid schizophrenia: Secondary | ICD-10-CM | POA: Diagnosis not present

## 2018-01-25 DIAGNOSIS — N319 Neuromuscular dysfunction of bladder, unspecified: Secondary | ICD-10-CM | POA: Diagnosis not present

## 2018-01-25 DIAGNOSIS — N189 Chronic kidney disease, unspecified: Secondary | ICD-10-CM | POA: Diagnosis not present

## 2018-01-25 DIAGNOSIS — K219 Gastro-esophageal reflux disease without esophagitis: Secondary | ICD-10-CM | POA: Diagnosis not present

## 2018-01-25 DIAGNOSIS — J189 Pneumonia, unspecified organism: Secondary | ICD-10-CM | POA: Diagnosis not present

## 2018-01-31 DIAGNOSIS — N183 Chronic kidney disease, stage 3 (moderate): Secondary | ICD-10-CM | POA: Diagnosis not present

## 2018-01-31 DIAGNOSIS — I129 Hypertensive chronic kidney disease with stage 1 through stage 4 chronic kidney disease, or unspecified chronic kidney disease: Secondary | ICD-10-CM | POA: Diagnosis not present

## 2018-01-31 DIAGNOSIS — R339 Retention of urine, unspecified: Secondary | ICD-10-CM | POA: Diagnosis not present

## 2018-01-31 DIAGNOSIS — F329 Major depressive disorder, single episode, unspecified: Secondary | ICD-10-CM | POA: Diagnosis not present

## 2018-01-31 DIAGNOSIS — M1991 Primary osteoarthritis, unspecified site: Secondary | ICD-10-CM | POA: Diagnosis not present

## 2018-01-31 DIAGNOSIS — Z9181 History of falling: Secondary | ICD-10-CM | POA: Diagnosis not present

## 2018-01-31 DIAGNOSIS — M24412 Recurrent dislocation, left shoulder: Secondary | ICD-10-CM | POA: Diagnosis not present

## 2018-01-31 DIAGNOSIS — Z466 Encounter for fitting and adjustment of urinary device: Secondary | ICD-10-CM | POA: Diagnosis not present

## 2018-01-31 DIAGNOSIS — D649 Anemia, unspecified: Secondary | ICD-10-CM | POA: Diagnosis not present

## 2018-01-31 DIAGNOSIS — F0390 Unspecified dementia without behavioral disturbance: Secondary | ICD-10-CM | POA: Diagnosis not present

## 2018-01-31 DIAGNOSIS — N319 Neuromuscular dysfunction of bladder, unspecified: Secondary | ICD-10-CM | POA: Diagnosis not present

## 2018-01-31 DIAGNOSIS — G40909 Epilepsy, unspecified, not intractable, without status epilepticus: Secondary | ICD-10-CM | POA: Diagnosis not present

## 2018-01-31 DIAGNOSIS — E039 Hypothyroidism, unspecified: Secondary | ICD-10-CM | POA: Diagnosis not present

## 2018-01-31 DIAGNOSIS — F209 Schizophrenia, unspecified: Secondary | ICD-10-CM | POA: Diagnosis not present

## 2018-02-08 DIAGNOSIS — N319 Neuromuscular dysfunction of bladder, unspecified: Secondary | ICD-10-CM | POA: Diagnosis not present

## 2018-02-08 DIAGNOSIS — R339 Retention of urine, unspecified: Secondary | ICD-10-CM | POA: Diagnosis not present

## 2018-02-08 DIAGNOSIS — F0390 Unspecified dementia without behavioral disturbance: Secondary | ICD-10-CM | POA: Diagnosis not present

## 2018-02-08 DIAGNOSIS — Z466 Encounter for fitting and adjustment of urinary device: Secondary | ICD-10-CM | POA: Diagnosis not present

## 2018-02-23 DIAGNOSIS — D696 Thrombocytopenia, unspecified: Secondary | ICD-10-CM | POA: Diagnosis not present

## 2018-02-23 DIAGNOSIS — G40909 Epilepsy, unspecified, not intractable, without status epilepticus: Secondary | ICD-10-CM | POA: Diagnosis not present

## 2018-02-23 DIAGNOSIS — F200 Paranoid schizophrenia: Secondary | ICD-10-CM | POA: Diagnosis not present

## 2018-02-23 DIAGNOSIS — N319 Neuromuscular dysfunction of bladder, unspecified: Secondary | ICD-10-CM | POA: Diagnosis not present

## 2018-03-03 DIAGNOSIS — F200 Paranoid schizophrenia: Secondary | ICD-10-CM | POA: Diagnosis not present

## 2018-03-03 DIAGNOSIS — F0391 Unspecified dementia with behavioral disturbance: Secondary | ICD-10-CM | POA: Diagnosis not present

## 2018-03-03 DIAGNOSIS — F411 Generalized anxiety disorder: Secondary | ICD-10-CM | POA: Diagnosis not present

## 2018-03-10 DIAGNOSIS — B351 Tinea unguium: Secondary | ICD-10-CM | POA: Diagnosis not present

## 2018-03-22 DIAGNOSIS — E7849 Other hyperlipidemia: Secondary | ICD-10-CM | POA: Diagnosis not present

## 2018-03-22 DIAGNOSIS — Z79899 Other long term (current) drug therapy: Secondary | ICD-10-CM | POA: Diagnosis not present

## 2018-03-22 DIAGNOSIS — E559 Vitamin D deficiency, unspecified: Secondary | ICD-10-CM | POA: Diagnosis not present

## 2018-03-22 DIAGNOSIS — E038 Other specified hypothyroidism: Secondary | ICD-10-CM | POA: Diagnosis not present

## 2018-03-22 DIAGNOSIS — D518 Other vitamin B12 deficiency anemias: Secondary | ICD-10-CM | POA: Diagnosis not present

## 2018-03-22 DIAGNOSIS — E119 Type 2 diabetes mellitus without complications: Secondary | ICD-10-CM | POA: Diagnosis not present

## 2018-03-23 DIAGNOSIS — I10 Essential (primary) hypertension: Secondary | ICD-10-CM | POA: Diagnosis not present

## 2018-03-23 DIAGNOSIS — R569 Unspecified convulsions: Secondary | ICD-10-CM | POA: Diagnosis not present

## 2018-03-23 DIAGNOSIS — N400 Enlarged prostate without lower urinary tract symptoms: Secondary | ICD-10-CM | POA: Diagnosis not present

## 2018-03-23 DIAGNOSIS — R339 Retention of urine, unspecified: Secondary | ICD-10-CM | POA: Diagnosis not present

## 2018-04-01 DIAGNOSIS — Z466 Encounter for fitting and adjustment of urinary device: Secondary | ICD-10-CM | POA: Diagnosis not present

## 2018-04-01 DIAGNOSIS — R339 Retention of urine, unspecified: Secondary | ICD-10-CM | POA: Diagnosis not present

## 2018-04-01 DIAGNOSIS — I129 Hypertensive chronic kidney disease with stage 1 through stage 4 chronic kidney disease, or unspecified chronic kidney disease: Secondary | ICD-10-CM | POA: Diagnosis not present

## 2018-04-01 DIAGNOSIS — D649 Anemia, unspecified: Secondary | ICD-10-CM | POA: Diagnosis not present

## 2018-04-01 DIAGNOSIS — Z9181 History of falling: Secondary | ICD-10-CM | POA: Diagnosis not present

## 2018-04-01 DIAGNOSIS — N319 Neuromuscular dysfunction of bladder, unspecified: Secondary | ICD-10-CM | POA: Diagnosis not present

## 2018-04-01 DIAGNOSIS — M1991 Primary osteoarthritis, unspecified site: Secondary | ICD-10-CM | POA: Diagnosis not present

## 2018-04-01 DIAGNOSIS — E039 Hypothyroidism, unspecified: Secondary | ICD-10-CM | POA: Diagnosis not present

## 2018-04-01 DIAGNOSIS — N183 Chronic kidney disease, stage 3 (moderate): Secondary | ICD-10-CM | POA: Diagnosis not present

## 2018-04-01 DIAGNOSIS — F329 Major depressive disorder, single episode, unspecified: Secondary | ICD-10-CM | POA: Diagnosis not present

## 2018-04-01 DIAGNOSIS — F0390 Unspecified dementia without behavioral disturbance: Secondary | ICD-10-CM | POA: Diagnosis not present

## 2018-04-01 DIAGNOSIS — G40909 Epilepsy, unspecified, not intractable, without status epilepticus: Secondary | ICD-10-CM | POA: Diagnosis not present

## 2018-04-01 DIAGNOSIS — F209 Schizophrenia, unspecified: Secondary | ICD-10-CM | POA: Diagnosis not present

## 2018-04-01 DIAGNOSIS — M24412 Recurrent dislocation, left shoulder: Secondary | ICD-10-CM | POA: Diagnosis not present

## 2018-04-07 DIAGNOSIS — N319 Neuromuscular dysfunction of bladder, unspecified: Secondary | ICD-10-CM | POA: Diagnosis not present

## 2018-04-07 DIAGNOSIS — F0390 Unspecified dementia without behavioral disturbance: Secondary | ICD-10-CM | POA: Diagnosis not present

## 2018-04-07 DIAGNOSIS — Z466 Encounter for fitting and adjustment of urinary device: Secondary | ICD-10-CM | POA: Diagnosis not present

## 2018-04-07 DIAGNOSIS — R339 Retention of urine, unspecified: Secondary | ICD-10-CM | POA: Diagnosis not present

## 2018-04-09 DIAGNOSIS — R410 Disorientation, unspecified: Secondary | ICD-10-CM | POA: Diagnosis not present

## 2018-04-09 DIAGNOSIS — R300 Dysuria: Secondary | ICD-10-CM | POA: Diagnosis not present

## 2018-04-14 DIAGNOSIS — F0391 Unspecified dementia with behavioral disturbance: Secondary | ICD-10-CM | POA: Diagnosis not present

## 2018-04-14 DIAGNOSIS — F200 Paranoid schizophrenia: Secondary | ICD-10-CM | POA: Diagnosis not present

## 2018-04-14 DIAGNOSIS — F411 Generalized anxiety disorder: Secondary | ICD-10-CM | POA: Diagnosis not present

## 2018-04-16 DIAGNOSIS — M24412 Recurrent dislocation, left shoulder: Secondary | ICD-10-CM | POA: Diagnosis not present

## 2018-04-16 DIAGNOSIS — G9341 Metabolic encephalopathy: Secondary | ICD-10-CM | POA: Diagnosis not present

## 2018-04-16 DIAGNOSIS — I10 Essential (primary) hypertension: Secondary | ICD-10-CM

## 2018-04-16 DIAGNOSIS — F418 Other specified anxiety disorders: Secondary | ICD-10-CM | POA: Diagnosis not present

## 2018-04-16 DIAGNOSIS — G40909 Epilepsy, unspecified, not intractable, without status epilepticus: Secondary | ICD-10-CM | POA: Diagnosis not present

## 2018-04-16 DIAGNOSIS — E860 Dehydration: Secondary | ICD-10-CM

## 2018-04-16 DIAGNOSIS — N289 Disorder of kidney and ureter, unspecified: Secondary | ICD-10-CM

## 2018-04-16 DIAGNOSIS — F0390 Unspecified dementia without behavioral disturbance: Secondary | ICD-10-CM | POA: Diagnosis not present

## 2018-04-16 DIAGNOSIS — Z66 Do not resuscitate: Secondary | ICD-10-CM | POA: Diagnosis not present

## 2018-04-16 DIAGNOSIS — N183 Chronic kidney disease, stage 3 (moderate): Secondary | ICD-10-CM | POA: Diagnosis not present

## 2018-04-16 DIAGNOSIS — R0902 Hypoxemia: Secondary | ICD-10-CM | POA: Diagnosis not present

## 2018-04-16 DIAGNOSIS — K4091 Unilateral inguinal hernia, without obstruction or gangrene, recurrent: Secondary | ICD-10-CM | POA: Diagnosis not present

## 2018-04-16 DIAGNOSIS — R680 Hypothermia, not associated with low environmental temperature: Secondary | ICD-10-CM | POA: Diagnosis not present

## 2018-04-16 DIAGNOSIS — Z86718 Personal history of other venous thrombosis and embolism: Secondary | ICD-10-CM | POA: Diagnosis not present

## 2018-04-16 DIAGNOSIS — D696 Thrombocytopenia, unspecified: Secondary | ICD-10-CM | POA: Diagnosis not present

## 2018-04-16 DIAGNOSIS — E039 Hypothyroidism, unspecified: Secondary | ICD-10-CM

## 2018-04-16 DIAGNOSIS — J181 Lobar pneumonia, unspecified organism: Secondary | ICD-10-CM

## 2018-04-16 DIAGNOSIS — B9689 Other specified bacterial agents as the cause of diseases classified elsewhere: Secondary | ICD-10-CM | POA: Diagnosis not present

## 2018-04-16 DIAGNOSIS — Z23 Encounter for immunization: Secondary | ICD-10-CM | POA: Diagnosis not present

## 2018-04-16 DIAGNOSIS — I129 Hypertensive chronic kidney disease with stage 1 through stage 4 chronic kidney disease, or unspecified chronic kidney disease: Secondary | ICD-10-CM | POA: Diagnosis not present

## 2018-04-16 DIAGNOSIS — R5381 Other malaise: Secondary | ICD-10-CM | POA: Diagnosis not present

## 2018-04-16 DIAGNOSIS — Z7401 Bed confinement status: Secondary | ICD-10-CM | POA: Diagnosis not present

## 2018-04-16 DIAGNOSIS — R001 Bradycardia, unspecified: Secondary | ICD-10-CM | POA: Diagnosis not present

## 2018-04-16 DIAGNOSIS — R456 Violent behavior: Secondary | ICD-10-CM | POA: Diagnosis not present

## 2018-04-16 DIAGNOSIS — R531 Weakness: Secondary | ICD-10-CM | POA: Diagnosis not present

## 2018-04-16 DIAGNOSIS — N390 Urinary tract infection, site not specified: Secondary | ICD-10-CM | POA: Diagnosis not present

## 2018-04-16 DIAGNOSIS — Z1624 Resistance to multiple antibiotics: Secondary | ICD-10-CM | POA: Diagnosis not present

## 2018-04-16 DIAGNOSIS — Z79899 Other long term (current) drug therapy: Secondary | ICD-10-CM | POA: Diagnosis not present

## 2018-04-16 DIAGNOSIS — R4182 Altered mental status, unspecified: Secondary | ICD-10-CM

## 2018-04-16 DIAGNOSIS — N179 Acute kidney failure, unspecified: Secondary | ICD-10-CM

## 2018-04-16 DIAGNOSIS — B961 Klebsiella pneumoniae [K. pneumoniae] as the cause of diseases classified elsewhere: Secondary | ICD-10-CM | POA: Diagnosis not present

## 2018-04-16 DIAGNOSIS — F209 Schizophrenia, unspecified: Secondary | ICD-10-CM | POA: Diagnosis not present

## 2018-04-16 DIAGNOSIS — T83511A Infection and inflammatory reaction due to indwelling urethral catheter, initial encounter: Secondary | ICD-10-CM

## 2018-04-16 DIAGNOSIS — I82409 Acute embolism and thrombosis of unspecified deep veins of unspecified lower extremity: Secondary | ICD-10-CM

## 2018-04-16 DIAGNOSIS — J9811 Atelectasis: Secondary | ICD-10-CM | POA: Diagnosis not present

## 2018-04-21 DIAGNOSIS — R4182 Altered mental status, unspecified: Secondary | ICD-10-CM

## 2018-04-26 DIAGNOSIS — N400 Enlarged prostate without lower urinary tract symptoms: Secondary | ICD-10-CM | POA: Diagnosis not present

## 2018-04-26 DIAGNOSIS — I10 Essential (primary) hypertension: Secondary | ICD-10-CM | POA: Diagnosis not present

## 2018-04-26 DIAGNOSIS — N319 Neuromuscular dysfunction of bladder, unspecified: Secondary | ICD-10-CM | POA: Diagnosis not present

## 2018-04-26 DIAGNOSIS — E039 Hypothyroidism, unspecified: Secondary | ICD-10-CM | POA: Diagnosis not present

## 2018-04-27 DIAGNOSIS — G9341 Metabolic encephalopathy: Secondary | ICD-10-CM | POA: Diagnosis not present

## 2018-04-27 DIAGNOSIS — N390 Urinary tract infection, site not specified: Secondary | ICD-10-CM | POA: Diagnosis not present

## 2018-04-27 DIAGNOSIS — G40909 Epilepsy, unspecified, not intractable, without status epilepticus: Secondary | ICD-10-CM | POA: Diagnosis not present

## 2018-04-27 DIAGNOSIS — N319 Neuromuscular dysfunction of bladder, unspecified: Secondary | ICD-10-CM | POA: Diagnosis not present

## 2018-04-28 DIAGNOSIS — E785 Hyperlipidemia, unspecified: Secondary | ICD-10-CM | POA: Diagnosis not present

## 2018-04-28 DIAGNOSIS — G4089 Other seizures: Secondary | ICD-10-CM | POA: Diagnosis not present

## 2018-04-28 DIAGNOSIS — I10 Essential (primary) hypertension: Secondary | ICD-10-CM | POA: Diagnosis not present

## 2018-04-28 DIAGNOSIS — D649 Anemia, unspecified: Secondary | ICD-10-CM | POA: Diagnosis not present

## 2018-04-28 DIAGNOSIS — E039 Hypothyroidism, unspecified: Secondary | ICD-10-CM | POA: Diagnosis not present

## 2018-04-28 DIAGNOSIS — E559 Vitamin D deficiency, unspecified: Secondary | ICD-10-CM | POA: Diagnosis not present

## 2018-04-28 DIAGNOSIS — Z79899 Other long term (current) drug therapy: Secondary | ICD-10-CM | POA: Diagnosis not present

## 2018-04-28 DIAGNOSIS — E119 Type 2 diabetes mellitus without complications: Secondary | ICD-10-CM | POA: Diagnosis not present

## 2018-04-28 DIAGNOSIS — N183 Chronic kidney disease, stage 3 (moderate): Secondary | ICD-10-CM | POA: Diagnosis not present

## 2018-05-01 DIAGNOSIS — R52 Pain, unspecified: Secondary | ICD-10-CM | POA: Diagnosis not present

## 2018-05-01 DIAGNOSIS — M25512 Pain in left shoulder: Secondary | ICD-10-CM | POA: Diagnosis not present

## 2018-05-01 DIAGNOSIS — W19XXXA Unspecified fall, initial encounter: Secondary | ICD-10-CM | POA: Diagnosis not present

## 2018-05-01 DIAGNOSIS — T68XXXA Hypothermia, initial encounter: Secondary | ICD-10-CM | POA: Diagnosis not present

## 2018-05-01 DIAGNOSIS — R404 Transient alteration of awareness: Secondary | ICD-10-CM | POA: Diagnosis not present

## 2018-05-01 DIAGNOSIS — S43005A Unspecified dislocation of left shoulder joint, initial encounter: Secondary | ICD-10-CM | POA: Diagnosis not present

## 2018-05-01 DIAGNOSIS — R402411 Glasgow coma scale score 13-15, in the field [EMT or ambulance]: Secondary | ICD-10-CM | POA: Diagnosis not present

## 2018-05-02 DIAGNOSIS — S43005A Unspecified dislocation of left shoulder joint, initial encounter: Secondary | ICD-10-CM

## 2018-05-02 DIAGNOSIS — M24412 Recurrent dislocation, left shoulder: Secondary | ICD-10-CM | POA: Diagnosis not present

## 2018-05-02 DIAGNOSIS — N183 Chronic kidney disease, stage 3 (moderate): Secondary | ICD-10-CM | POA: Diagnosis not present

## 2018-05-02 DIAGNOSIS — I129 Hypertensive chronic kidney disease with stage 1 through stage 4 chronic kidney disease, or unspecified chronic kidney disease: Secondary | ICD-10-CM | POA: Diagnosis not present

## 2018-05-02 DIAGNOSIS — R410 Disorientation, unspecified: Secondary | ICD-10-CM | POA: Diagnosis not present

## 2018-05-02 DIAGNOSIS — F209 Schizophrenia, unspecified: Secondary | ICD-10-CM | POA: Diagnosis not present

## 2018-05-02 DIAGNOSIS — Z743 Need for continuous supervision: Secondary | ICD-10-CM | POA: Diagnosis not present

## 2018-05-02 DIAGNOSIS — R569 Unspecified convulsions: Secondary | ICD-10-CM | POA: Diagnosis not present

## 2018-05-02 DIAGNOSIS — Z87440 Personal history of urinary (tract) infections: Secondary | ICD-10-CM | POA: Diagnosis not present

## 2018-05-02 DIAGNOSIS — T68XXXA Hypothermia, initial encounter: Secondary | ICD-10-CM

## 2018-05-02 DIAGNOSIS — F0390 Unspecified dementia without behavioral disturbance: Secondary | ICD-10-CM | POA: Diagnosis not present

## 2018-05-02 DIAGNOSIS — M1990 Unspecified osteoarthritis, unspecified site: Secondary | ICD-10-CM | POA: Diagnosis not present

## 2018-05-02 DIAGNOSIS — R279 Unspecified lack of coordination: Secondary | ICD-10-CM | POA: Diagnosis not present

## 2018-05-02 DIAGNOSIS — Z79899 Other long term (current) drug therapy: Secondary | ICD-10-CM | POA: Diagnosis not present

## 2018-05-02 DIAGNOSIS — Z888 Allergy status to other drugs, medicaments and biological substances status: Secondary | ICD-10-CM | POA: Diagnosis not present

## 2018-05-02 DIAGNOSIS — F418 Other specified anxiety disorders: Secondary | ICD-10-CM | POA: Diagnosis not present

## 2018-05-02 DIAGNOSIS — E039 Hypothyroidism, unspecified: Secondary | ICD-10-CM

## 2018-05-02 DIAGNOSIS — K219 Gastro-esophageal reflux disease without esophagitis: Secondary | ICD-10-CM | POA: Diagnosis not present

## 2018-05-02 DIAGNOSIS — I10 Essential (primary) hypertension: Secondary | ICD-10-CM

## 2018-05-04 DIAGNOSIS — G9341 Metabolic encephalopathy: Secondary | ICD-10-CM | POA: Diagnosis not present

## 2018-05-04 DIAGNOSIS — B961 Klebsiella pneumoniae [K. pneumoniae] as the cause of diseases classified elsewhere: Secondary | ICD-10-CM | POA: Diagnosis not present

## 2018-05-04 DIAGNOSIS — E039 Hypothyroidism, unspecified: Secondary | ICD-10-CM | POA: Diagnosis not present

## 2018-05-04 DIAGNOSIS — N390 Urinary tract infection, site not specified: Secondary | ICD-10-CM | POA: Diagnosis not present

## 2018-05-05 DIAGNOSIS — D649 Anemia, unspecified: Secondary | ICD-10-CM | POA: Diagnosis not present

## 2018-05-05 DIAGNOSIS — Z79899 Other long term (current) drug therapy: Secondary | ICD-10-CM | POA: Diagnosis not present

## 2018-05-11 DIAGNOSIS — E039 Hypothyroidism, unspecified: Secondary | ICD-10-CM | POA: Diagnosis not present

## 2018-05-11 DIAGNOSIS — N183 Chronic kidney disease, stage 3 (moderate): Secondary | ICD-10-CM | POA: Diagnosis not present

## 2018-05-11 DIAGNOSIS — F200 Paranoid schizophrenia: Secondary | ICD-10-CM | POA: Diagnosis not present

## 2018-05-11 DIAGNOSIS — N319 Neuromuscular dysfunction of bladder, unspecified: Secondary | ICD-10-CM | POA: Diagnosis not present

## 2018-05-22 DIAGNOSIS — N319 Neuromuscular dysfunction of bladder, unspecified: Secondary | ICD-10-CM | POA: Diagnosis not present

## 2018-05-22 DIAGNOSIS — Z0000 Encounter for general adult medical examination without abnormal findings: Secondary | ICD-10-CM | POA: Diagnosis not present

## 2018-05-22 DIAGNOSIS — R1310 Dysphagia, unspecified: Secondary | ICD-10-CM | POA: Diagnosis not present

## 2018-05-22 DIAGNOSIS — G250 Essential tremor: Secondary | ICD-10-CM | POA: Diagnosis not present

## 2018-05-22 DIAGNOSIS — E039 Hypothyroidism, unspecified: Secondary | ICD-10-CM | POA: Diagnosis not present

## 2018-05-23 DIAGNOSIS — I10 Essential (primary) hypertension: Secondary | ICD-10-CM | POA: Diagnosis not present

## 2018-05-23 DIAGNOSIS — G40909 Epilepsy, unspecified, not intractable, without status epilepticus: Secondary | ICD-10-CM | POA: Diagnosis not present

## 2018-05-23 DIAGNOSIS — M24412 Recurrent dislocation, left shoulder: Secondary | ICD-10-CM | POA: Diagnosis not present

## 2018-05-23 DIAGNOSIS — N183 Chronic kidney disease, stage 3 (moderate): Secondary | ICD-10-CM | POA: Diagnosis not present

## 2018-06-04 DIAGNOSIS — R0602 Shortness of breath: Secondary | ICD-10-CM | POA: Diagnosis not present

## 2018-06-04 DIAGNOSIS — R680 Hypothermia, not associated with low environmental temperature: Secondary | ICD-10-CM | POA: Diagnosis not present

## 2018-06-04 DIAGNOSIS — Z66 Do not resuscitate: Secondary | ICD-10-CM | POA: Diagnosis not present

## 2018-06-04 DIAGNOSIS — N289 Disorder of kidney and ureter, unspecified: Secondary | ICD-10-CM | POA: Diagnosis not present

## 2018-06-04 DIAGNOSIS — F0390 Unspecified dementia without behavioral disturbance: Secondary | ICD-10-CM | POA: Diagnosis not present

## 2018-06-04 DIAGNOSIS — F418 Other specified anxiety disorders: Secondary | ICD-10-CM | POA: Diagnosis not present

## 2018-06-04 DIAGNOSIS — Z79899 Other long term (current) drug therapy: Secondary | ICD-10-CM | POA: Diagnosis not present

## 2018-06-04 DIAGNOSIS — R1310 Dysphagia, unspecified: Secondary | ICD-10-CM | POA: Diagnosis not present

## 2018-06-04 DIAGNOSIS — J9601 Acute respiratory failure with hypoxia: Secondary | ICD-10-CM | POA: Diagnosis not present

## 2018-06-04 DIAGNOSIS — Z7401 Bed confinement status: Secondary | ICD-10-CM | POA: Diagnosis not present

## 2018-06-04 DIAGNOSIS — I10 Essential (primary) hypertension: Secondary | ICD-10-CM | POA: Diagnosis not present

## 2018-06-04 DIAGNOSIS — R0902 Hypoxemia: Secondary | ICD-10-CM | POA: Diagnosis not present

## 2018-06-04 DIAGNOSIS — R4182 Altered mental status, unspecified: Secondary | ICD-10-CM | POA: Diagnosis not present

## 2018-06-04 DIAGNOSIS — E039 Hypothyroidism, unspecified: Secondary | ICD-10-CM | POA: Diagnosis not present

## 2018-06-04 DIAGNOSIS — B961 Klebsiella pneumoniae [K. pneumoniae] as the cause of diseases classified elsewhere: Secondary | ICD-10-CM | POA: Diagnosis not present

## 2018-06-04 DIAGNOSIS — N183 Chronic kidney disease, stage 3 (moderate): Secondary | ICD-10-CM | POA: Diagnosis not present

## 2018-06-04 DIAGNOSIS — G250 Essential tremor: Secondary | ICD-10-CM | POA: Diagnosis not present

## 2018-06-04 DIAGNOSIS — R069 Unspecified abnormalities of breathing: Secondary | ICD-10-CM | POA: Diagnosis not present

## 2018-06-04 DIAGNOSIS — Z1635 Resistance to multiple antimicrobial drugs: Secondary | ICD-10-CM | POA: Diagnosis not present

## 2018-06-04 DIAGNOSIS — N390 Urinary tract infection, site not specified: Secondary | ICD-10-CM | POA: Diagnosis not present

## 2018-06-04 DIAGNOSIS — Z960 Presence of urogenital implants: Secondary | ICD-10-CM | POA: Diagnosis not present

## 2018-06-04 DIAGNOSIS — I129 Hypertensive chronic kidney disease with stage 1 through stage 4 chronic kidney disease, or unspecified chronic kidney disease: Secondary | ICD-10-CM | POA: Diagnosis not present

## 2018-06-04 DIAGNOSIS — R404 Transient alteration of awareness: Secondary | ICD-10-CM | POA: Diagnosis not present

## 2018-06-04 DIAGNOSIS — G9341 Metabolic encephalopathy: Secondary | ICD-10-CM | POA: Diagnosis not present

## 2018-06-04 DIAGNOSIS — R4020 Unspecified coma: Secondary | ICD-10-CM | POA: Diagnosis not present

## 2018-06-04 DIAGNOSIS — J690 Pneumonitis due to inhalation of food and vomit: Secondary | ICD-10-CM | POA: Diagnosis not present

## 2018-06-04 DIAGNOSIS — J189 Pneumonia, unspecified organism: Secondary | ICD-10-CM | POA: Diagnosis not present

## 2018-06-04 DIAGNOSIS — M2550 Pain in unspecified joint: Secondary | ICD-10-CM | POA: Diagnosis not present

## 2018-06-04 DIAGNOSIS — F203 Undifferentiated schizophrenia: Secondary | ICD-10-CM | POA: Diagnosis not present

## 2018-06-04 DIAGNOSIS — N179 Acute kidney failure, unspecified: Secondary | ICD-10-CM | POA: Diagnosis not present

## 2018-06-04 DIAGNOSIS — K219 Gastro-esophageal reflux disease without esophagitis: Secondary | ICD-10-CM | POA: Diagnosis not present

## 2018-06-04 DIAGNOSIS — D696 Thrombocytopenia, unspecified: Secondary | ICD-10-CM | POA: Diagnosis not present

## 2018-06-04 DIAGNOSIS — R05 Cough: Secondary | ICD-10-CM | POA: Diagnosis not present

## 2018-06-04 DIAGNOSIS — T68XXXA Hypothermia, initial encounter: Secondary | ICD-10-CM | POA: Diagnosis not present

## 2018-06-04 DIAGNOSIS — G40909 Epilepsy, unspecified, not intractable, without status epilepticus: Secondary | ICD-10-CM | POA: Diagnosis not present

## 2018-06-04 DIAGNOSIS — R8290 Unspecified abnormal findings in urine: Secondary | ICD-10-CM | POA: Diagnosis not present

## 2018-06-05 DIAGNOSIS — R8290 Unspecified abnormal findings in urine: Secondary | ICD-10-CM

## 2018-06-05 DIAGNOSIS — E039 Hypothyroidism, unspecified: Secondary | ICD-10-CM

## 2018-06-06 DIAGNOSIS — G250 Essential tremor: Secondary | ICD-10-CM | POA: Diagnosis not present

## 2018-06-06 DIAGNOSIS — E039 Hypothyroidism, unspecified: Secondary | ICD-10-CM | POA: Diagnosis not present

## 2018-06-06 DIAGNOSIS — R1310 Dysphagia, unspecified: Secondary | ICD-10-CM | POA: Diagnosis not present

## 2018-06-06 DIAGNOSIS — I10 Essential (primary) hypertension: Secondary | ICD-10-CM | POA: Diagnosis not present

## 2018-06-10 DIAGNOSIS — N390 Urinary tract infection, site not specified: Secondary | ICD-10-CM

## 2018-06-10 DIAGNOSIS — G9341 Metabolic encephalopathy: Secondary | ICD-10-CM

## 2018-06-10 DIAGNOSIS — Z1635 Resistance to multiple antimicrobial drugs: Secondary | ICD-10-CM

## 2018-06-10 DIAGNOSIS — Z960 Presence of urogenital implants: Secondary | ICD-10-CM

## 2018-06-10 DIAGNOSIS — J9601 Acute respiratory failure with hypoxia: Secondary | ICD-10-CM

## 2018-06-10 DIAGNOSIS — J690 Pneumonitis due to inhalation of food and vomit: Secondary | ICD-10-CM

## 2018-06-10 DIAGNOSIS — N183 Chronic kidney disease, stage 3 (moderate): Secondary | ICD-10-CM

## 2018-06-13 DIAGNOSIS — J9690 Respiratory failure, unspecified, unspecified whether with hypoxia or hypercapnia: Secondary | ICD-10-CM | POA: Diagnosis not present

## 2018-06-13 DIAGNOSIS — G9341 Metabolic encephalopathy: Secondary | ICD-10-CM | POA: Diagnosis not present

## 2018-06-13 DIAGNOSIS — N390 Urinary tract infection, site not specified: Secondary | ICD-10-CM | POA: Diagnosis not present

## 2018-06-13 DIAGNOSIS — J690 Pneumonitis due to inhalation of food and vomit: Secondary | ICD-10-CM | POA: Diagnosis not present

## 2018-07-24 DEATH — deceased
